# Patient Record
Sex: Female | Born: 1980
Health system: Southern US, Community
[De-identification: ages and names within clinical notes are randomized; demographics above are authoritative.]

## PROBLEM LIST (undated history)

## (undated) DIAGNOSIS — I10 Essential (primary) hypertension: Secondary | ICD-10-CM

## (undated) DIAGNOSIS — E785 Hyperlipidemia, unspecified: Secondary | ICD-10-CM

## (undated) HISTORY — DX: Essential (primary) hypertension: I10

## (undated) HISTORY — DX: Hyperlipidemia, unspecified: E78.5

---

## 2003-02-15 ENCOUNTER — Emergency Department (HOSPITAL_COMMUNITY): Admission: EM | Admit: 2003-02-15 | Discharge: 2003-02-15 | Payer: Self-pay | Admitting: *Deleted

## 2010-02-17 ENCOUNTER — Encounter: Admission: RE | Admit: 2010-02-17 | Discharge: 2010-02-17 | Payer: Self-pay | Admitting: Family Medicine

## 2012-01-05 ENCOUNTER — Ambulatory Visit (INDEPENDENT_AMBULATORY_CARE_PROVIDER_SITE_OTHER): Payer: 59 | Admitting: Family Medicine

## 2012-01-05 VITALS — BP 136/97 | HR 73 | Temp 98.6°F | Resp 18 | Ht 66.0 in | Wt 164.0 lb

## 2012-01-05 DIAGNOSIS — M6530 Trigger finger, unspecified finger: Secondary | ICD-10-CM

## 2012-01-05 MED ORDER — METHYLPREDNISOLONE 4 MG PO TABS: ORAL_TABLET | ORAL | Status: DC

## 2012-01-05 NOTE — Patient Instructions (Addendum)
Trigger Finger Trigger finger (digital tendinitis and stenosing tenosynovitis) is a common disorder that causes an often painful catching of the fingers or thumb. It occurs as a clicking, snapping or locking of a finger in the palm of the hand. The reason for this is that there is a problem with the tendons which flex the fingers sliding smoothly through their sheaths. The cause of this may be inflammation of the tendon and sheath, or from a thickening or nodule in the tendon. The condition may occur in any finger or a couple fingers at the same time. The cause may be overuse while doing the same activity over and over again with your hands.  Tendons are the tough cords that connect the muscles to bones. Muscles and tendons are part of the system which allows your body to move. When muscles contract in the forearm on the palm side, they pull the tendons toward the elbow and cause the fingers and thumb to bend (flex) toward the palm. These are the flexor tendons. The tendons slide through a slippery smooth membrane (synovium) which is called the tendon sheath. The sheaths have areas of tough fibrous tissues surrounding them which hold the tendons close to the bone. These are called pulleys because they work like a pulley. The first pulley is in the palm of the hand near the crease which runs across your palm. If the area of the tendon thickening is near the pulley, the tendon cannot slide smoothly through the pulley and this causes the trigger finger. The finger may lock with the finger curled or suddenly straighten out with a snap. This is more common in patients with rheumatoid arthritis and diabetes. Left untreated, the condition may get worse to the point where the finger becomes locked in flexion, like making a fist, or less commonly locked with the finger straightened out. DIAGNOSIS  Your caregiver will easily make this diagnosis on examination. TREATMENT   Splinting for 6 to 8 weeks of time may be  helpful. Use the splints as your caregiver suggests.   Heat used for twenty minutes at least four times a day followed by ice packs for twenty minutes unless directed otherwise by your caregiver may be helpful. If you find either heat or cold seems to be making the problem worse, quit using them and ask your caregiver for directions.   Cortisone injections along with splinting may speed up recovery. Several injections may be required. Cortisone may give relief after one injection.   Only take over-the-counter or prescription medicines for pain, discomfort, or fever as directed by your caregiver.   Surgery is another treatment that may be used if conservative treatments using injection and splinting does not work. Surgery can be minor without incisions (a cut does not have to be made) and can be done with a needle through the skin. No stitches are needed and most patients may return to work the same day.   Other surgical choices involve an open procedure where the surgeon opens the hand through a small incision (cut) and cuts the pulley so the tendon can again slide smoothly. Your hand will still work fine. This small operation requires stitches and the recovery will be a little longer and the incisions will need to be protected until completely healed. You may have to limit your activities for up to 6 months.   Occupational or hand therapy may be required if there is stiffness remaining in the finger.  RISKS AND COMPLICATIONS Complications are uncommon but   some problems that may occur are:  Recurrence of the trigger finger. This does not mean that the surgery was not well done. It simply means that you may have formed scar tissue following surgery that causes the problem to reoccur.   Infection which could ruin the results of the surgery and can result in a finger which is frozen and can not move normally.   Nerve injury is possible which could result in permanent numbness of one or more fingers.   CARE AFTER SURGERY  Elevate your hand above your heart and use ice as instructed.   Follow instructions regarding finger motion/exercise.   Keep the surgical wound dry for at least 48 hrs or longer if instructed.   Keep your follow-up appointments.   Return to work and normal activities as instructed.  SEEK IMMEDIATE MEDICAL CARE IF:  Your problems are getting worse or you do not obtain relief from the treatment. Document Released: 05/01/2004 Document Revised: 07/01/2011 Document Reviewed: 12/24/2008 [employer] Patient Information 2012 [employer]. 

## 2012-01-05 NOTE — Progress Notes (Signed)
31 year old woman who comes in with pain in her right thumb. She works hanging close repetitively. She is right-handed. The pain has gotten worse in the last 24 hours and is associated with swelling around the base of the right thumb. She's had no specific trauma and has no wrist pain or neck pain.  Objective: There's a popping sensation when patient flexes her right thumb and this is felt in the web space between her thumb and index finger at about the MCP joint. There is minimal swelling in the right thenar area as well. Range of motion is normal both hands, fingers, wrist, and elbow.  Skin: No ecchymosis or rash  Assessment: Trigger finger of thumb flexor  Plan: If the referral and Medrol 4 mg 2 daily for one week. Patient also provided with a work note for today and a thumb spica splint.

## 2012-07-11 ENCOUNTER — Ambulatory Visit (INDEPENDENT_AMBULATORY_CARE_PROVIDER_SITE_OTHER): Payer: 59 | Admitting: Physician Assistant

## 2012-07-11 VITALS — BP 130/90 | HR 93 | Temp 98.5°F | Resp 18 | Ht 65.0 in | Wt 177.0 lb

## 2012-07-11 DIAGNOSIS — R509 Fever, unspecified: Secondary | ICD-10-CM

## 2012-07-11 DIAGNOSIS — R52 Pain, unspecified: Secondary | ICD-10-CM

## 2012-07-11 DIAGNOSIS — J029 Acute pharyngitis, unspecified: Secondary | ICD-10-CM

## 2012-07-11 LAB — POCT RAPID STREP A (OFFICE): Rapid Strep A Screen: NEGATIVE

## 2012-07-11 MED ORDER — ACETAMINOPHEN 325 MG PO TABS: 1000.0000 mg | ORAL_TABLET | Freq: Once | ORAL | Status: DC

## 2012-07-11 MED ORDER — OSELTAMIVIR PHOSPHATE 75 MG PO CAPS: 75.0000 mg | ORAL_CAPSULE | Freq: Two times a day (BID) | ORAL | Status: DC

## 2012-07-11 NOTE — Progress Notes (Signed)
  Subjective:    Patient ID: Christina Gallegos, female    DOB: [birth year], 31 y.o.   MRN: [id number]  HPI 31 yr old AAF presents with a 1 day h/o ST, body aches, fever, runny nose, slight cough.  It all came on suddenly yesterday. She hasn't taken any OTC meds. No known exposure to strep or flu.  Review of Systems  All other systems reviewed and are negative.       Objective:   Physical Exam  Nursing note and vitals reviewed. Constitutional: She is oriented to person, place, and time. She appears well-developed and well-nourished.  HENT:  Head: Normocephalic and atraumatic.  Mouth/Throat: Oropharyngeal exudate: PND and mild erythema.  Neck: Normal range of motion. Neck supple. No thyromegaly present.  Cardiovascular: Normal rate, regular rhythm and normal heart sounds.   Pulmonary/Chest: Effort normal and breath sounds normal.  Lymphadenopathy:    She has no cervical adenopathy.  Neurological: She is alert and oriented to person, place, and time.  Skin: Skin is warm and dry.  Psychiatric: She has a normal mood and affect. Her behavior is normal.   Results for orders placed in visit on 07/11/12  POCT INFLUENZA A/B      Component Value Range   Influenza A, POC Negative     Influenza B, POC Negative    POCT RAPID STREP A (OFFICE)      Component Value Range   Rapid Strep A Screen Negative  Negative        Assessment & Plan:  Clinically suspicious of flu-will treat with tamiflu, fluids, and rest.  advil or tylenol as needed.  Recheck in 48-72 hrs if not much improved; sooner if worse.

## 2013-03-31 ENCOUNTER — Ambulatory Visit (INDEPENDENT_AMBULATORY_CARE_PROVIDER_SITE_OTHER): Payer: 59 | Admitting: Family Medicine

## 2013-03-31 VITALS — BP 116/76 | HR 65 | Temp 98.3°F | Resp 16 | Ht 65.5 in | Wt 178.0 lb

## 2013-03-31 DIAGNOSIS — R519 Headache, unspecified: Secondary | ICD-10-CM

## 2013-03-31 DIAGNOSIS — Z0000 Encounter for general adult medical examination without abnormal findings: Secondary | ICD-10-CM

## 2013-03-31 LAB — POCT CBC
Granulocyte percent: 48.7 %G (ref 37–80)
HCT, POC: 41.4 % (ref 37.7–47.9)
Hemoglobin: 13.4 g/dL (ref 12.2–16.2)
Lymph, poc: 1.2 (ref 0.6–3.4)
MCH, POC: 30.5 pg (ref 27–31.2)
MCHC: 32.4 g/dL (ref 31.8–35.4)
MCV: 94.1 fL (ref 80–97)
MID (cbc): 0.2 (ref 0–0.9)
MPV: 8.5 fL (ref 0–99.8)
POC Granulocyte: 1.4 — AB (ref 2–6.9)
POC LYMPH PERCENT: 42.9 %L (ref 10–50)
POC MID %: 8.4 %M (ref 0–12)
Platelet Count, POC: 272 10*3/uL (ref 142–424)
RBC: 4.4 M/uL (ref 4.04–5.48)
RDW, POC: 13.2 %
WBC: 2.8 10*3/uL — AB (ref 4.6–10.2)

## 2013-03-31 LAB — COMPREHENSIVE METABOLIC PANEL
ALT: 11 U/L (ref 0–35)
AST: 16 U/L (ref 0–37)
Albumin: 4.3 g/dL (ref 3.5–5.2)
Alkaline Phosphatase: 45 U/L (ref 39–117)
BUN: 9 mg/dL (ref 6–23)
CO2: 27 mEq/L (ref 19–32)
Calcium: 9.1 mg/dL (ref 8.4–10.5)
Chloride: 104 mEq/L (ref 96–112)
Creat: 0.78 mg/dL (ref 0.50–1.10)
Glucose, Bld: 91 mg/dL (ref 70–99)
Potassium: 4.2 mEq/L (ref 3.5–5.3)
Sodium: 136 mEq/L (ref 135–145)
Total Bilirubin: 0.4 mg/dL (ref 0.3–1.2)
Total Protein: 7.3 g/dL (ref 6.0–8.3)

## 2013-03-31 LAB — POCT UA - MICROSCOPIC ONLY
Casts, Ur, LPF, POC: NEGATIVE
Crystals, Ur, HPF, POC: NEGATIVE
Yeast, UA: NEGATIVE

## 2013-03-31 LAB — POCT URINALYSIS DIPSTICK
Bilirubin, UA: NEGATIVE
Glucose, UA: NEGATIVE
Ketones, UA: NEGATIVE
Leukocytes, UA: NEGATIVE
Nitrite, UA: NEGATIVE
Protein, UA: NEGATIVE
Spec Grav, UA: 1.02
Urobilinogen, UA: 1
pH, UA: 7

## 2013-03-31 LAB — TSH: TSH: 2.948 u[IU]/mL (ref 0.350–4.500)

## 2013-03-31 MED ORDER — KETOPROFEN 50 MG PO CAPS: 50.0000 mg | ORAL_CAPSULE | Freq: Four times a day (QID) | ORAL | Status: DC | PRN

## 2013-03-31 NOTE — Patient Instructions (Signed)
The blood count suggest that you have a viral infection that is causing these headaches. I would like you to come back in 5-6 days to recheck the blood count and make sure that your body is responding nicely to the headache medicine. At that time we will go ahead and review all the labs that we've done today to

## 2013-03-31 NOTE — Progress Notes (Signed)
Annual physical exam - Plan: POCT CBC, POCT urinalysis dipstick, POCT UA - Microscopic Only, Comprehensive metabolic panel, TSH, Pap IG (Image Guided)  Signed, Elvina Sidle, MD   Patient ID: Christina Gallegos MRN: [id number], DOB: [birth year], 32 y.o. Date of Encounter: 03/31/2013, 9:05 AM  Primary Physician: No PCP Per Patient  Chief Complaint: Physical (CPE)  HPI: 32 y.o. y/o female with history of noted below here for CPE.  Doing well. 32 year old woman who works at [employer]. She's been having headaches for last 4 days. His headaches occur in the afternoon and are migratory, meaning that they move around from side to side of her head. She's tried aspirin and Aleve without much improvement. At one time she was able leg down to the headache go away. Said no nausea or scotoma. He does not have a family history of migraines.  LMP: Aug 16 Pap:  Years ago QMV:HQION Review of Systems: Consitutional: No fever, chills, fatigue, night sweats, lymphadenopathy, or weight changes. Eyes: No visual changes, eye redness, or discharge. ENT/Mouth: Ears: No otalgia, tinnitus, hearing loss, discharge. Nose: No congestion, rhinorrhea, sinus pain, or epistaxis. Throat: No sore throat, post nasal drip, or teeth pain. Cardiovascular: No CP, palpitations, diaphoresis, DOE, edema, orthopnea, PND. Respiratory: No cough, hemoptysis, SOB, or wheezing. Gastrointestinal: No anorexia, dysphagia, reflux, pain, nausea, vomiting, hematemesis, diarrhea, constipation, BRBPR, or melena. Breast: No discharge, pain, swelling, or mass. Genitourinary: No dysuria, frequency, urgency, hematuria, incontinence, nocturia, amenorrhea, vaginal discharge, pruritis, burning, abnormal bleeding, or pain. Musculoskeletal: No decreased ROM, myalgias, stiffness, joint swelling, or weakness. Skin: No rash, erythema, lesion changes, pain, warmth, jaundice, or pruritis. Neurological: No dizziness, syncope, seizures, tremors, memory  loss, coordination problems, or paresthesias. Psychological: No anxiety, depression, hallucinations, SI/HI. Endocrine: No fatigue, polydipsia, polyphagia, polyuria, or known diabetes. All other systems were reviewed and are otherwise negative.  No past medical history on file.   No past surgical history on file.  Home Meds:  Prior to Admission medications   Medication Sig Start Date End Date Taking? Authorizing Provider  ibuprofen (ADVIL,MOTRIN) 200 MG tablet Take 200 mg by mouth every 6 (six) hours as needed for pain.   Yes Historical Provider, MD  oseltamivir (TAMIFLU) 75 MG capsule Take 1 capsule (75 mg total) by mouth 2 (two) times daily. 07/11/12   Anders Simmonds, PA-C    Allergies: No Known Allergies  History   Social History  . Marital Status: Single    Spouse Name: N/A    Number of Children: N/A  . Years of Education: N/A   Occupational History  . Not on file.   Social History Main Topics  . Smoking status: Never Smoker   . Smokeless tobacco: Not on file  . Alcohol Use: No  . Drug Use: No  . Sexual Activity: Yes   Other Topics Concern  . Not on file   Social History Narrative  . No narrative on file    No family history on file.  Physical Exam: Blood pressure 116/76, pulse 65, temperature 98.3 F (36.8 C), temperature source Oral, resp. rate 16, height 5' 5.5" (1.664 m), weight 178 lb (80.74 kg), last menstrual period 03/20/2013, SpO2 99.00%., Body mass index is 29.16 kg/(m^2). General: Well developed, well nourished, in no acute distress. HEENT: Normocephalic, atraumatic. Conjunctiva pink, sclera non-icteric. Pupils 2 mm constricting to 1 mm, round, regular, and equally reactive to light and accomodation. EOMI. Internal auditory canal clear. TMs with good cone of light and without pathology. Nasal mucosa pink. Nares  are without discharge. No sinus tenderness. Oral mucosa pink. Dentition: Good shape. Pharynx without exudate.   Neck: Supple. Trachea midline.  No thyromegaly. Full ROM. No lymphadenopathy. Lungs: Clear to auscultation bilaterally without wheezes, rales, or rhonchi. Breathing is of normal effort and unlabored. Cardiovascular: RRR with S1 S2. No murmurs, rubs, or gallops appreciated. Distal pulses 2+ symmetrically. No carotid or abdominal bruits. Breast: Symmetrical. No masses. Nipples without discharge. Abdomen: Soft, non-tender, non-distended with normoactive bowel sounds. No hepatosplenomegaly or masses. No rebound/guarding. No CVA tenderness. Without hernias.  Genitourinary:  External genitalia without lesions. Vaginal mucosa pink. Cervix pink and without discharge. No cervical or adnexal tenderness. Pap smear taken Musculoskeletal: Full range of motion and 5/5 strength throughout. Without swelling, atrophy, tenderness, crepitus, or warmth. Extremities without clubbing, cyanosis, or edema. Calves supple. Skin: Warm and moist without erythema, ecchymosis, wounds, or rash. Neuro: A+Ox3. CN II-XII grossly intact. Moves all extremities spontaneously. Full sensation throughout. Normal gait. DTR 2+ throughout upper and lower extremities. Finger to nose intact. Psych:  Responds to questions appropriately with a normal affect.   Studies: CBC, CMET, Lipid, TSH all pending. Patient is generally healthy but does have recent headaches. Results for orders placed in visit on 03/31/13  POCT CBC      Result Value Range   WBC 2.8 (*) 4.6 - 10.2 K/uL   Lymph, poc 1.2  0.6 - 3.4   POC LYMPH PERCENT 42.9  10 - 50 %L   MID (cbc) 0.2  0 - 0.9   POC MID % 8.4  0 - 12 %M   POC Granulocyte 1.4 (*) 2 - 6.9   Granulocyte percent 48.7  37 - 80 %G   RBC 4.40  4.04 - 5.48 M/uL   Hemoglobin 13.4  12.2 - 16.2 g/dL   HCT, POC 29.5  62.1 - 47.9 %   MCV 94.1  80 - 97 fL   MCH, POC 30.5  27 - 31.2 pg   MCHC 32.4  31.8 - 35.4 g/dL   RDW, POC 30.8     Platelet Count, POC 272  142 - 424 K/uL   MPV 8.5  0 - 99.8 fL  POCT URINALYSIS DIPSTICK      Result Value  Range   Color, UA yellow     Clarity, UA clear     Glucose, UA neg     Bilirubin, UA neg     Ketones, UA neg     Spec Grav, UA 1.020     Blood, UA trace     pH, UA 7.0     Protein, UA neg     Urobilinogen, UA 1.0     Nitrite, UA neg     Leukocytes, UA Negative    POCT UA - MICROSCOPIC ONLY      Result Value Range   WBC, Ur, HPF, POC 2-5     RBC, urine, microscopic 0-2     Bacteria, U Microscopic trace     Mucus, UA moderate     Epithelial cells, urine per micros 8-12     Crystals, Ur, HPF, POC neg     Casts, Ur, LPF, POC neg     Yeast, UA neg     Amorphous small       Assessment/Plan:  33 y.o. y/o female here for CPE. The headaches appear to be tension related but with a low white blood count it's very possible that she has a viral infection going on. She'll need to have that  CBC rechecked in one week. Annual physical exam - Plan: POCT CBC, POCT urinalysis dipstick, POCT UA - Microscopic Only, Comprehensive metabolic panel, TSH, Pap IG (Image Guided)   Signed, Elvina Sidle, MD 03/31/2013 9:05 AM

## 2013-04-02 LAB — PAP IG (IMAGE GUIDED)

## 2013-04-16 ENCOUNTER — Encounter (HOSPITAL_COMMUNITY): Payer: Self-pay | Admitting: *Deleted

## 2013-04-16 ENCOUNTER — Emergency Department (HOSPITAL_COMMUNITY)
Admission: EM | Admit: 2013-04-16 | Discharge: 2013-04-17 | Disposition: A | Discharge status: Home / Self Care | Payer: 59 | Attending: Emergency Medicine | Admitting: Emergency Medicine

## 2013-04-16 DIAGNOSIS — R21 Rash and other nonspecific skin eruption: Secondary | ICD-10-CM | POA: Insufficient documentation

## 2013-04-16 DIAGNOSIS — L509 Urticaria, unspecified: Secondary | ICD-10-CM | POA: Insufficient documentation

## 2013-04-16 DIAGNOSIS — T6591XA Toxic effect of unspecified substance, accidental (unintentional), initial encounter: Secondary | ICD-10-CM | POA: Insufficient documentation

## 2013-04-16 DIAGNOSIS — T7840XA Allergy, unspecified, initial encounter: Secondary | ICD-10-CM | POA: Insufficient documentation

## 2013-04-16 DIAGNOSIS — Y929 Unspecified place or not applicable: Secondary | ICD-10-CM | POA: Insufficient documentation

## 2013-04-16 DIAGNOSIS — R1310 Dysphagia, unspecified: Secondary | ICD-10-CM | POA: Insufficient documentation

## 2013-04-16 DIAGNOSIS — Y939 Activity, unspecified: Secondary | ICD-10-CM | POA: Insufficient documentation

## 2013-04-16 NOTE — ED Notes (Signed)
Pt in c/ generalized rash since this am, states she thinks she is having an allergic reaction to something, states she feels like her face is swollen since this am, pt took benadryl this am, no airway involvement

## 2013-04-17 MED ORDER — FAMOTIDINE 20 MG PO TABS: 20.0000 mg | ORAL_TABLET | Freq: Two times a day (BID) | ORAL | Status: DC

## 2013-04-17 MED ORDER — DIPHENHYDRAMINE HCL 25 MG PO CAPS
25.0000 mg | ORAL_CAPSULE | Freq: Once | ORAL | Status: AC
  Administered 2013-04-17: 25 mg via ORAL
  Filled 2013-04-17: qty 1

## 2013-04-17 MED ORDER — PREDNISONE 20 MG PO TABS
60.0000 mg | ORAL_TABLET | Freq: Once | ORAL | Status: AC
  Administered 2013-04-17: 60 mg via ORAL
  Filled 2013-04-17: qty 3

## 2013-04-17 MED ORDER — FAMOTIDINE 20 MG PO TABS
20.0000 mg | ORAL_TABLET | Freq: Once | ORAL | Status: AC
  Administered 2013-04-17: 20 mg via ORAL
  Filled 2013-04-17: qty 1

## 2013-04-17 MED ORDER — PREDNISONE 20 MG PO TABS: 40.0000 mg | ORAL_TABLET | Freq: Every day | ORAL | Status: DC

## 2013-04-17 NOTE — ED Provider Notes (Signed)
CSN: [id number]     Arrival date & time 04/16/13  2229 History   First MD Initiated Contact with Patient 04/17/13 0011     Chief Complaint  Patient presents with  . Allergic Reaction   (Consider location/radiation/quality/duration/timing/severity/associated sxs/prior Treatment) HPI Comments: Patient noticed itchy red raised areas on her abdomen this morning that than spread to other body areas that would wax and wane in location and intensity Took OTC Benadryl with temprary relief Denies SOB, throat closing  tachycardia   Patient is a 32 y.o. female presenting with allergic reaction. The history is provided by the patient.  Allergic Reaction Presenting symptoms: difficulty swallowing, itching and rash   Presenting symptoms: no difficulty breathing, no swelling and no wheezing   Itching:    Location:  Full body   Severity:  Mild   Duration:  1 day   Timing:  Intermittent   Progression:  Worsening Severity:  Mild Prior allergic episodes:  No prior episodes Context: no chemicals, no cosmetics, no dairy/milk products, no eggs, no food allergies, no insect bite/sting, no jewelry/metal, no medications, no nuts and no poison ivy   Relieved by:  Antihistamines   History reviewed. No pertinent past medical history. History reviewed. No pertinent past surgical history. History reviewed. No pertinent family history. History  Substance Use Topics  . Smoking status: Never Smoker   . Smokeless tobacco: Not on file  . Alcohol Use: No   OB History   Grav Para Term Preterm Abortions TAB SAB Ect Mult Living                 Review of Systems  Unable to perform ROS Constitutional: Negative for fever and chills.  HENT: Positive for trouble swallowing.   Respiratory: Negative for shortness of breath and wheezing.   Gastrointestinal: Negative for nausea and abdominal pain.  Skin: Positive for itching and rash.  Neurological: Negative for dizziness.  All other systems reviewed and are  negative.    Allergies  Review of patient's allergies indicates no known allergies.  Home Medications   Current Outpatient Rx  Name  Route  Sig  Dispense  Refill  . famotidine (PEPCID) 20 MG tablet   Oral   Take 1 tablet (20 mg total) by mouth 2 (two) times daily.   20 tablet   0   . predniSONE (DELTASONE) 20 MG tablet   Oral   Take 2 tablets (40 mg total) by mouth daily.   10 tablet   0    BP 130/87  Pulse 94  Temp(Src) 98 F (36.7 C) (Oral)  Resp 20  Wt 172 lb (78.019 kg)  BMI 28.18 kg/m2  SpO2 100%  LMP 03/20/2013 Physical Exam  Nursing note and vitals reviewed. Constitutional: She is oriented to person, place, and time. She appears well-developed and well-nourished. No distress.  HENT:  Head: Normocephalic and atraumatic.  Eyes: Pupils are equal, round, and reactive to light.  Neck: Normal range of motion.  Cardiovascular: Normal rate.   Pulmonary/Chest: Effort normal and breath sounds normal. No respiratory distress. She has no wheezes.  Musculoskeletal: She exhibits no tenderness.  Neurological: She is alert and oriented to person, place, and time.  Skin: Skin is warm. Rash noted.    ED Course  Procedures (including critical care time) Labs Review Labs Reviewed - No data to display Imaging Review No results found.  MDM   1. Urticaria    Will treat with Pepcid, Prednisone      Cipriano Mile  Manus Rudd, NP 04/17/13 [phone]

## 2013-04-17 NOTE — ED Provider Notes (Signed)
Medical screening examination/treatment/procedure(s) were performed by non-physician practitioner and as supervising physician I was immediately available for consultation/collaboration.  [name], MD 04/17/13 0604 

## 2014-10-31 ENCOUNTER — Other Ambulatory Visit: Payer: Self-pay | Admitting: Physician Assistant

## 2014-10-31 ENCOUNTER — Other Ambulatory Visit (HOSPITAL_COMMUNITY)
Admission: RE | Admit: 2014-10-31 | Discharge: 2014-10-31 | Disposition: A | Discharge status: Home / Self Care | Payer: 59 | Source: Ambulatory Visit | Attending: Family Medicine | Admitting: Family Medicine

## 2014-10-31 DIAGNOSIS — Z124 Encounter for screening for malignant neoplasm of cervix: Secondary | ICD-10-CM | POA: Insufficient documentation

## 2014-11-05 LAB — CYTOLOGY - PAP

## 2015-04-28 ENCOUNTER — Ambulatory Visit (INDEPENDENT_AMBULATORY_CARE_PROVIDER_SITE_OTHER): Payer: 59 | Admitting: Family Medicine

## 2015-04-28 ENCOUNTER — Ambulatory Visit
Admission: RE | Admit: 2015-04-28 | Discharge: 2015-04-28 | Disposition: A | Discharge status: Home / Self Care | Payer: 59 | Source: Ambulatory Visit | Attending: Urgent Care | Admitting: Urgent Care

## 2015-04-28 VITALS — BP 126/80 | HR 112 | Temp 98.9°F | Ht 65.0 in | Wt 162.0 lb

## 2015-04-28 DIAGNOSIS — R102 Pelvic and perineal pain: Secondary | ICD-10-CM | POA: Diagnosis not present

## 2015-04-28 DIAGNOSIS — Z8742 Personal history of other diseases of the female genital tract: Secondary | ICD-10-CM

## 2015-04-28 DIAGNOSIS — Z86018 Personal history of other benign neoplasm: Secondary | ICD-10-CM

## 2015-04-28 DIAGNOSIS — N83201 Unspecified ovarian cyst, right side: Secondary | ICD-10-CM | POA: Diagnosis not present

## 2015-04-28 LAB — POCT URINALYSIS DIP (MANUAL ENTRY)
BILIRUBIN UA: NEGATIVE
Glucose, UA: NEGATIVE
Ketones, POC UA: NEGATIVE
LEUKOCYTES UA: NEGATIVE
NITRITE UA: NEGATIVE
PH UA: 5.5
PROTEIN UA: NEGATIVE
Spec Grav, UA: 1.015
Urobilinogen, UA: 0.2

## 2015-04-28 LAB — POCT CBC
Granulocyte percent: 81.2 %G — AB (ref 37–80)
HCT, POC: 37 % — AB (ref 37.7–47.9)
Hemoglobin: 12.2 g/dL (ref 12.2–16.2)
Lymph, poc: 1 (ref 0.6–3.4)
MCH, POC: 27.8 pg (ref 27–31.2)
MCHC: 32.9 g/dL (ref 31.8–35.4)
MCV: 84.6 fL (ref 80–97)
MID (CBC): 0.2 (ref 0–0.9)
MPV: 7.2 fL (ref 0–99.8)
PLATELET COUNT, POC: 314 10*3/uL (ref 142–424)
POC Granulocyte: 5.1 (ref 2–6.9)
POC LYMPH %: 15.1 % (ref 10–50)
POC MID %: 3.7 %M (ref 0–12)
RBC: 4.38 M/uL (ref 4.04–5.48)
RDW, POC: 14.4 %
WBC: 6.3 10*3/uL (ref 4.6–10.2)

## 2015-04-28 LAB — POCT WET + KOH PREP
TRICH BY WET PREP: ABSENT
Yeast by KOH: ABSENT
Yeast by wet prep: ABSENT

## 2015-04-28 LAB — POC MICROSCOPIC URINALYSIS (UMFC): Mucus: ABSENT

## 2015-04-28 LAB — POCT URINE PREGNANCY: PREG TEST UR: NEGATIVE

## 2015-04-28 MED ORDER — OXYCODONE-ACETAMINOPHEN 5-325 MG PO TABS: 1.0000 | ORAL_TABLET | Freq: Three times a day (TID) | ORAL | Status: DC | PRN

## 2015-04-28 NOTE — Progress Notes (Signed)
MRN: [id number] DOB: 23-Feb-1981  Subjective:   Christina Gallegos is a 34 y.o. female presenting for chief complaint of Abdominal Pain; Nausea; and Emesis  Reports ~2 month history of intermittent pelvic pain, pain has been constant since last night, also had nausea and vomiting x1. Pain is crampy in nature and does not radiate, sometimes has dyspareunia. Has not tried any medications for relief. Denies fever, genital rashes, vaginal discharge, dysuria, hematuria, urinary frequency, cloudy or malodorous urine, abdominal pain, flank pain, decreased appetite. Last menstrual cycle was 1st week in 02/2015. Patient has very heavy cycles, has a cycle every month except for this past September. Of note, in 2011, patient had very similar pain and was diagnosed with ruptured ovarian cyst as seen on pelvic CT. She was also diagnosed with a fibroid. Does not take any contraception. She is married, denies concern for STI. Denies any other aggravating or relieving factors, no other questions or concerns.  Jaila has a current medication list which includes the following prescription(s): multiple vitamins-minerals. Also has No Known Allergies.  Essance  has no past medical history on file. Also  has no past surgical history on file.  Objective:   Vitals: BP 126/80 mmHg  Pulse 112  Temp(Src) 98.9 F (37.2 C) (Oral)  Ht  (1.651 m)  Wt 162 lb (73.483 kg)  BMI 26.96 kg/m2  SpO2 99%  LMP 04/07/2015 (Approximate)  Pulse was 88 on recheck by PA-Jannifer Fischler.   Physical Exam  Constitutional: She is oriented to person, place, and time. She appears well-developed and well-nourished.  Cardiovascular: Normal rate, regular rhythm and intact distal pulses.  Exam reveals no gallop and no friction rub.   No murmur heard. Pulmonary/Chest: No respiratory distress. She has no wheezes. She has no rales.  Abdominal: Soft. Bowel sounds are normal. She exhibits no distension and no mass. There is tenderness (throughout,  worst in pelvic area).  Genitourinary: There is no rash, tenderness, lesion or injury on the right labia. There is no rash, tenderness, lesion or injury on the left labia. Uterus is not deviated, not enlarged and not tender. Cervix exhibits motion tenderness. Cervix exhibits no discharge and no friability. Right adnexum displays tenderness. Right adnexum displays no mass and no fullness. Left adnexum displays tenderness (more than right side). Left adnexum displays no mass and no fullness. No erythema, tenderness or bleeding in the vagina. No foreign body around the vagina. No signs of injury around the vagina. Vaginal discharge (excessive thick white discharge) found.  Musculoskeletal: She exhibits no edema.  Neurological: She is alert and oriented to person, place, and time.  Skin: Skin is warm and dry. No rash noted. No erythema. No pallor.  Psychiatric: She has a normal mood and affect.   Results for orders placed or performed in visit on 04/28/15 (from the past 24 hour(s))  POCT urinalysis dipstick     Status: Abnormal   Collection Time: 04/28/15  9:36 AM  Result Value Ref Range   Color, UA yellow yellow   Clarity, UA clear clear   Glucose, UA negative negative   Bilirubin, UA negative negative   Ketones, POC UA negative negative   Spec Grav, UA 1.015    Blood, UA moderate (A) negative   pH, UA 5.5    Protein Ur, POC negative negative   Urobilinogen, UA 0.2    Nitrite, UA Negative Negative   Leukocytes, UA Negative Negative  POCT Microscopic Urinalysis ([facility])     Status: Abnormal  Collection Time: 04/28/15  9:36 AM  Result Value Ref Range   WBC,UR,HPF,POC None None WBC/hpf   RBC,UR,HPF,POC Moderate (A) None RBC/hpf   Bacteria None None   Mucus Absent Absent   Epithelial Cells, UR Per Microscopy Few (A) None cells/hpf  POCT urine pregnancy     Status: None   Collection Time: 04/28/15  9:36 AM  Result Value Ref Range   Preg Test, Ur Negative Negative  POCT Wet + KOH Prep ([facility])      Status: Abnormal   Collection Time: 04/28/15 10:11 AM  Result Value Ref Range   Yeast by KOH Absent Present, Absent   Yeast by wet prep Absent Present, Absent   WBC by wet prep Few None, Few   Clue Cells Wet Prep HPF POC Few (A) None   Trich by wet prep Absent Present, Absent   Bacteria Wet Prep HPF POC Moderate (A) None, Few   Epithelial Cells By [employer] Pref ([facility]) Moderate (A) None, Few   RBC,UR,HPF,POC None None RBC/hpf  POCT CBC     Status: Abnormal   Collection Time: 04/28/15 10:34 AM  Result Value Ref Range   WBC 6.3 4.6 - 10.2 K/uL   Lymph, poc 1.0 0.6 - 3.4   POC LYMPH PERCENT 15.1 10 - 50 %L   MID (cbc) 0.2 0 - 0.9   POC MID % 3.7 0 - 12 %M   POC Granulocyte 5.1 2 - 6.9   Granulocyte percent 81.2 (A) 37 - 80 %G   RBC 4.38 4.04 - 5.48 M/uL   Hemoglobin 12.2 12.2 - 16.2 g/dL   HCT, POC 16.1 (A) 09.6 - 47.9 %   MCV 84.6 80 - 97 fL   MCH, POC 27.8 27 - 31.2 pg   MCHC 32.9 31.8 - 35.4 g/dL   RDW, POC 04.5 %   Platelet Count, POC 314 142 - 424 K/uL   MPV 7.2 0 - 99.8 fL   Assessment and Plan :   1. Pelvic pain in female 2. History of ovarian cyst 3. History of uterine fibroid - Unclear etiology, labs pending, suspect recurrent ovarian cyst versus fibroid. STAT US today and consider CT abdomen/pelvis if [place] negative. Percocet for pain. Work note provided. Consider starting OCPs at follow up.  Wallis Bamberg, PA-C [facility] [phone] 04/28/2015 9:07 AM   addnd by Arthor Captain, MD.  I have examined this pt's belly and have discussed case with the pt and with Mr. Urban Gibson.  Received [place] results as below-    EXAM: TRANSABDOMINAL AND TRANSVAGINAL ULTRASOUND OF PELVIS  TECHNIQUE: Both transabdominal and transvaginal ultrasound examinations of the pelvis were performed. Transabdominal technique was performed for global imaging of the pelvis including uterus, ovaries, adnexal regions, and pelvic cul-de-sac. It was necessary to  proceed with endovaginal exam following the transabdominal exam to visualize the endometrium.  COMPARISON: CT 02/17/2010  FINDINGS: Uterus  Measurements: 9.6 x 4.1 x 5.0 cm. Multiple predominately subserosal fibroids. Subserosal right fundal fibroid measures up to 3.0 cm. Subserosal posterior fibroid measures up to 3.1 cm. Left subserosal fundal fibroid measures up to 2.8 cm. Several other smaller fibroids less than 2 cm noted.  Endometrium Thickness: 12 mm in thickness. No focal abnormality visualized.  Right ovary Measurements: 4.6 x 4.5 x 5.2 cm. 3.8 x 3.6 x 2.7 cm complex cyst with reticular pattern internal echoes.  Left ovary Measurements: 3.6 x 2.3 x 3.7 cm. Multiple small follicles. Normal appearance/no adnexal  mass. Other findings  Small amount of free fluid in the pelvis.  IMPRESSION: Fibroid uterus as above. 3.8 cm complex right ovarian cyst, likely hemorrhagic cyst. This could be followed with repeat ultrasound in 3-6 months to ensure resolution. Small amount of free fluid.  Discussed with radiologist- we did not do doppler flow on her ovaries.  However there is good color flow making torsion unlikely.  Called pt to discuss- she just took a percocet a few minutes ago, is still having pain.  Advised her that her pain is most likely due to this ovarian cyst.  Especially given normal WBC count appendicitis is unlikely.  However if the percocet does not help her pain, she should proceed to the ER for further evaluation (perhaps a CT) and treatment of her pain. Also, we will plan to repeat her [place] in 3-6 months to make sure the cyst resolved.  I will go ahead and order this now

## 2015-04-28 NOTE — Patient Instructions (Addendum)
Pelvic Pain Female pelvic pain can be caused by many different things and start from a variety of places. Pelvic pain refers to pain that is located in the lower half of the abdomen and between your hips. The pain may occur over a short period of time (acute) or may be reoccurring (chronic). The cause of pelvic pain may be related to disorders affecting the female reproductive organs (gynecologic), but it may also be related to the bladder, kidney stones, an intestinal complication, or muscle or skeletal problems. Getting help right away for pelvic pain is important, especially if there has been severe, sharp, or a sudden onset of unusual pain. It is also important to get help right away because some types of pelvic pain can be life threatening.  CAUSES  Below are only some of the causes of pelvic pain. The causes of pelvic pain can be in one of several categories.   Gynecologic.  Pelvic inflammatory disease.  Sexually transmitted infection.  Ovarian cyst or a twisted ovarian ligament (ovarian torsion).  Uterine lining that grows outside the uterus (endometriosis).  Fibroids, cysts, or tumors.  Ovulation.  Pregnancy.  Pregnancy that occurs outside the uterus (ectopic pregnancy).  Miscarriage.  Labor.  Abruption of the placenta or ruptured uterus.  Infection.  Uterine infection (endometritis).  Bladder infection.  Diverticulitis.  Miscarriage related to a uterine infection (septic abortion).  Bladder.  Inflammation of the bladder (cystitis).  Kidney stone(s).  Gastrointestinal.  Constipation.  Diverticulitis.  Neurologic.  Trauma.  Feeling pelvic pain because of mental or emotional causes (psychosomatic).  Cancers of the bowel or pelvis. EVALUATION  Your caregiver will want to take a careful history of your concerns. This includes recent changes in your health, a careful gynecologic history of your periods (menses), and a sexual history. Obtaining your family  history and medical history is also important. Your caregiver may suggest a pelvic exam. A pelvic exam will help identify the location and severity of the pain. It also helps in the evaluation of which organ system may be involved. In order to identify the cause of the pelvic pain and be properly treated, your caregiver may order tests. These tests may include:   A pregnancy test.  Pelvic ultrasonography.  An X-ray exam of the abdomen.  A urinalysis or evaluation of vaginal discharge.  Blood tests. HOME CARE INSTRUCTIONS   Only take over-the-counter or prescription medicines for pain, discomfort, or fever as directed by your caregiver.   Rest as directed by your caregiver.   Eat a balanced diet.   Drink enough fluids to make your urine clear or pale yellow, or as directed.   Avoid sexual intercourse if it causes pain.   Apply warm or cold compresses to the lower abdomen depending on which one helps the pain.   Avoid stressful situations.   Keep a journal of your pelvic pain. Write down when it started, where the pain is located, and if there are things that seem to be associated with the pain, such as food or your menstrual cycle.  Follow up with your caregiver as directed.  SEEK MEDICAL CARE IF:  Your medicine does not help your pain.  You have abnormal vaginal discharge. SEEK IMMEDIATE MEDICAL CARE IF:   You have heavy bleeding from the vagina.   Your pelvic pain increases.   You feel light-headed or faint.   You have chills.   You have pain with urination or blood in your urine.   You have uncontrolled diarrhea   or vomiting.   You have a fever or persistent symptoms for more than 3 days.  You have a fever and your symptoms suddenly get worse.   You are being physically or sexually abused.  MAKE SURE YOU:  Understand these instructions.  Will watch your condition.  Will get help if you are not doing well or get worse. Document Released:  06/08/2004 Document Revised: 11/26/2013 Document Reviewed: 11/01/2011 [facility] Patient Information 2015 [place], [place]. This information is not intended to replace advice given to you by your health care provider. Make sure you discuss any questions you have with your health care provider.   Your appointment is with [facility] @ 2:25 pm today, located at [address] [place], [phone]. One hour before your appointment, you should drink 32 ounces of water and hold your bladder until your appointment time. Bladder must be full at the time of study.

## 2015-04-29 ENCOUNTER — Telehealth: Payer: Self-pay | Admitting: Family Medicine

## 2015-04-29 LAB — GC/CHLAMYDIA PROBE AMP
CT PROBE, AMP APTIMA: NEGATIVE
GC PROBE AMP APTIMA: NEGATIVE

## 2015-04-29 NOTE — Telephone Encounter (Signed)
Called to check on her- she reports that her pain is waxing and waning. Encouraged her to go to the ER if not doing ok, or to come in for a recheck at clinic

## 2015-05-01 ENCOUNTER — Telehealth: Payer: Self-pay | Admitting: Urgent Care

## 2015-05-01 NOTE — Telephone Encounter (Signed)
Patient reports that she is doing significantly better with regard to her pelvic pain. She has a female PCP with [facility] on [address], [place], [place]. Will refer to [facility] for management counseling on multiple fibroids and complex right ovarian cyst as a source for her severe pelvic pain.

## 2015-05-06 ENCOUNTER — Telehealth: Payer: Self-pay

## 2015-05-06 NOTE — Telephone Encounter (Signed)
Records faxed to [place] at [facility].

## 2015-05-06 NOTE — Telephone Encounter (Signed)
Shameka from [place] at [facility] left voicemail on Monday at 4:42pm requesting records and ultrasound records for patient. Cb# [phone] and fax# is [phone].

## 2015-06-09 ENCOUNTER — Other Ambulatory Visit (HOSPITAL_COMMUNITY): Payer: Self-pay | Admitting: Obstetrics & Gynecology

## 2015-06-09 DIAGNOSIS — Z3169 Encounter for other general counseling and advice on procreation: Secondary | ICD-10-CM

## 2015-06-11 ENCOUNTER — Encounter (INDEPENDENT_AMBULATORY_CARE_PROVIDER_SITE_OTHER): Payer: Self-pay

## 2015-06-11 ENCOUNTER — Ambulatory Visit (HOSPITAL_COMMUNITY)
Admission: RE | Admit: 2015-06-11 | Discharge: 2015-06-11 | Disposition: A | Discharge status: Home / Self Care | Payer: 59 | Source: Ambulatory Visit | Attending: Obstetrics & Gynecology | Admitting: Obstetrics & Gynecology

## 2015-06-11 DIAGNOSIS — Z3169 Encounter for other general counseling and advice on procreation: Secondary | ICD-10-CM

## 2015-06-11 DIAGNOSIS — N979 Female infertility, unspecified: Secondary | ICD-10-CM | POA: Insufficient documentation

## 2015-06-11 MED ORDER — IOHEXOL 300 MG/ML  SOLN
20.0000 mL | Freq: Once | INTRAMUSCULAR | Status: DC | PRN
  Administered 2015-06-11: 20 mL
  Filled 2015-06-11: qty 20

## 2015-11-10 ENCOUNTER — Other Ambulatory Visit (HOSPITAL_COMMUNITY)
Admission: RE | Admit: 2015-11-10 | Discharge: 2015-11-10 | Disposition: A | Discharge status: Home / Self Care | Payer: 59 | Source: Ambulatory Visit | Attending: Family Medicine | Admitting: Family Medicine

## 2015-11-10 DIAGNOSIS — Z124 Encounter for screening for malignant neoplasm of cervix: Secondary | ICD-10-CM | POA: Insufficient documentation

## 2015-11-11 ENCOUNTER — Other Ambulatory Visit: Payer: Self-pay | Admitting: Physician Assistant

## 2015-11-18 LAB — CYTOLOGY - PAP

## 2017-09-16 ENCOUNTER — Ambulatory Visit: Payer: 59 | Admitting: Urgent Care

## 2018-03-29 DIAGNOSIS — N898 Other specified noninflammatory disorders of vagina: Secondary | ICD-10-CM | POA: Diagnosis not present

## 2018-03-29 DIAGNOSIS — Z113 Encounter for screening for infections with a predominantly sexual mode of transmission: Secondary | ICD-10-CM | POA: Diagnosis not present

## 2018-07-27 ENCOUNTER — Other Ambulatory Visit: Payer: Self-pay | Admitting: Physician Assistant

## 2018-07-27 DIAGNOSIS — N6320 Unspecified lump in the left breast, unspecified quadrant: Secondary | ICD-10-CM

## 2018-08-02 ENCOUNTER — Ambulatory Visit
Admission: RE | Admit: 2018-08-02 | Discharge: 2018-08-02 | Disposition: A | Discharge status: Home / Self Care | Payer: BLUE CROSS/BLUE SHIELD | Source: Ambulatory Visit | Attending: Physician Assistant | Admitting: Physician Assistant

## 2018-08-02 ENCOUNTER — Ambulatory Visit
Admission: RE | Admit: 2018-08-02 | Discharge: 2018-08-02 | Disposition: A | Discharge status: Home / Self Care | Payer: 59 | Source: Ambulatory Visit | Attending: Physician Assistant | Admitting: Physician Assistant

## 2018-08-02 DIAGNOSIS — R922 Inconclusive mammogram: Secondary | ICD-10-CM | POA: Diagnosis not present

## 2018-08-02 DIAGNOSIS — N6320 Unspecified lump in the left breast, unspecified quadrant: Secondary | ICD-10-CM

## 2018-08-02 DIAGNOSIS — N6324 Unspecified lump in the left breast, lower inner quadrant: Secondary | ICD-10-CM | POA: Diagnosis not present

## 2018-11-21 DIAGNOSIS — R0989 Other specified symptoms and signs involving the circulatory and respiratory systems: Secondary | ICD-10-CM | POA: Diagnosis not present

## 2018-11-21 DIAGNOSIS — K219 Gastro-esophageal reflux disease without esophagitis: Secondary | ICD-10-CM | POA: Diagnosis not present

## 2018-11-21 DIAGNOSIS — J309 Allergic rhinitis, unspecified: Secondary | ICD-10-CM | POA: Diagnosis not present

## 2019-01-19 DIAGNOSIS — Z0000 Encounter for general adult medical examination without abnormal findings: Secondary | ICD-10-CM | POA: Diagnosis not present

## 2019-01-25 DIAGNOSIS — Z0000 Encounter for general adult medical examination without abnormal findings: Secondary | ICD-10-CM | POA: Diagnosis not present

## 2019-01-25 DIAGNOSIS — Z8249 Family history of ischemic heart disease and other diseases of the circulatory system: Secondary | ICD-10-CM | POA: Diagnosis not present

## 2019-01-30 DIAGNOSIS — R1313 Dysphagia, pharyngeal phase: Secondary | ICD-10-CM | POA: Diagnosis not present

## 2019-06-04 ENCOUNTER — Telehealth (INDEPENDENT_AMBULATORY_CARE_PROVIDER_SITE_OTHER): Payer: Self-pay | Admitting: Otolaryngology

## 2019-06-04 NOTE — Telephone Encounter (Signed)
Patient called about persistence of globus type symptoms which seem worse in the morning. I suggested that she may take the omeprazole twice daily for couple weeks to see if this improves symptoms.  I reassured her of normal upper airway examination.  She will continue with the Nasacort. If symptoms persist suggested possible visit with gastroenterology.

## 2019-06-13 DIAGNOSIS — Z113 Encounter for screening for infections with a predominantly sexual mode of transmission: Secondary | ICD-10-CM | POA: Diagnosis not present

## 2019-06-13 DIAGNOSIS — A599 Trichomoniasis, unspecified: Secondary | ICD-10-CM | POA: Diagnosis not present

## 2019-06-13 DIAGNOSIS — N898 Other specified noninflammatory disorders of vagina: Secondary | ICD-10-CM | POA: Diagnosis not present

## 2019-06-27 DIAGNOSIS — A599 Trichomoniasis, unspecified: Secondary | ICD-10-CM | POA: Diagnosis not present

## 2019-08-31 DIAGNOSIS — Z209 Contact with and (suspected) exposure to unspecified communicable disease: Secondary | ICD-10-CM | POA: Diagnosis not present

## 2020-01-22 ENCOUNTER — Other Ambulatory Visit (HOSPITAL_COMMUNITY)
Admission: RE | Admit: 2020-01-22 | Discharge: 2020-01-22 | Disposition: A | Discharge status: Home / Self Care | Payer: BC Managed Care – PPO | Source: Ambulatory Visit | Attending: Physician Assistant | Admitting: Physician Assistant

## 2020-01-22 ENCOUNTER — Other Ambulatory Visit: Payer: Self-pay | Admitting: Physician Assistant

## 2020-01-22 DIAGNOSIS — Z0000 Encounter for general adult medical examination without abnormal findings: Secondary | ICD-10-CM | POA: Diagnosis not present

## 2020-01-22 DIAGNOSIS — Z124 Encounter for screening for malignant neoplasm of cervix: Secondary | ICD-10-CM | POA: Diagnosis not present

## 2020-01-22 DIAGNOSIS — Z13220 Encounter for screening for lipoid disorders: Secondary | ICD-10-CM | POA: Diagnosis not present

## 2020-01-22 DIAGNOSIS — Z131 Encounter for screening for diabetes mellitus: Secondary | ICD-10-CM | POA: Diagnosis not present

## 2020-01-24 LAB — CYTOLOGY - PAP: Diagnosis: NEGATIVE

## 2020-04-14 DIAGNOSIS — L918 Other hypertrophic disorders of the skin: Secondary | ICD-10-CM | POA: Diagnosis not present

## 2020-04-25 DIAGNOSIS — U071 COVID-19: Secondary | ICD-10-CM | POA: Diagnosis not present

## 2020-05-27 DIAGNOSIS — L918 Other hypertrophic disorders of the skin: Secondary | ICD-10-CM | POA: Diagnosis not present

## 2020-08-03 DIAGNOSIS — Z20822 Contact with and (suspected) exposure to covid-19: Secondary | ICD-10-CM | POA: Diagnosis not present

## 2020-11-21 IMAGING — US ULTRASOUND LEFT BREAST LIMITED
1 series · 7 of 7 positions shown · non-contrast
Comparison: None.

CLINICAL DATA: 37-year-old with a superficial palpable lump
involving the INNER LEFT breast over the past 3 months with the
patient states has decreased in size in the interval though
persists. This is the patient's initial baseline mammogram.

Family history of breast cancer in her paternal grandmother.
EXAM:
DIGITAL DIAGNOSTIC BILATERAL MAMMOGRAM WITH CAD AND TOMO
ULTRASOUND LEFT BREAST

[Series 1: ultrasound left breast limited · 0.06mm/px · 7 of 7 slices shown]
[im 1/7]
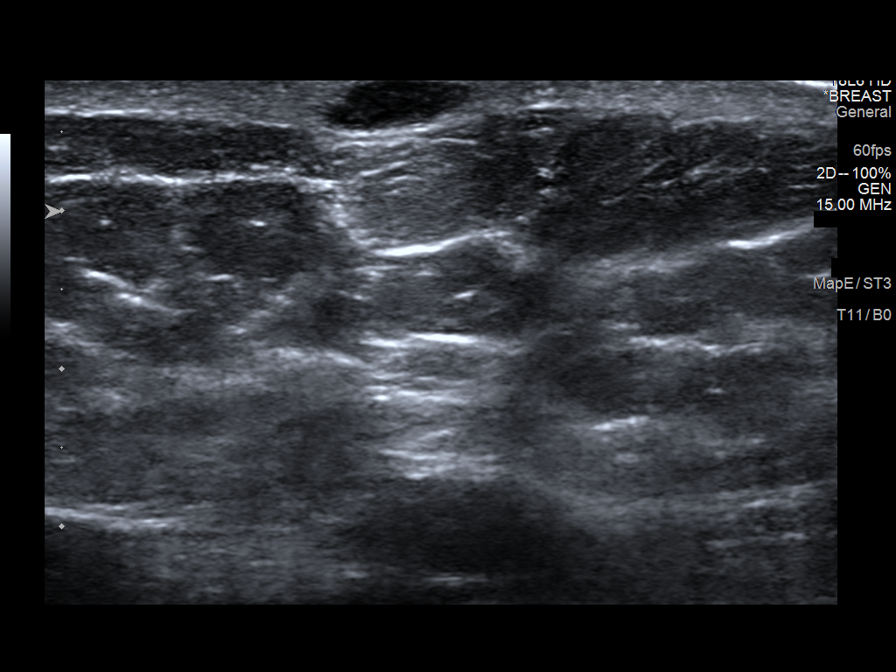
[im 2/7]
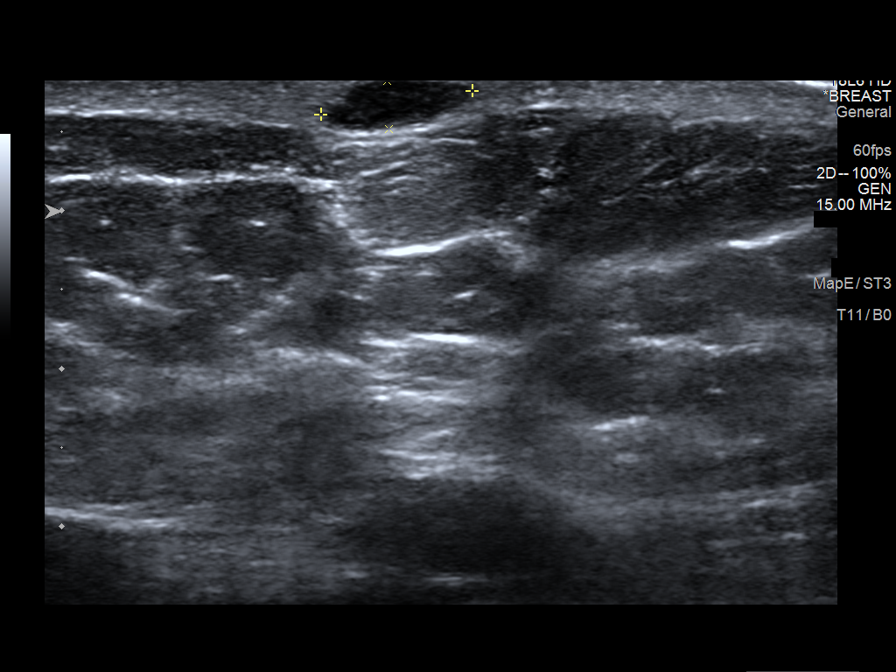
[im 3/7]
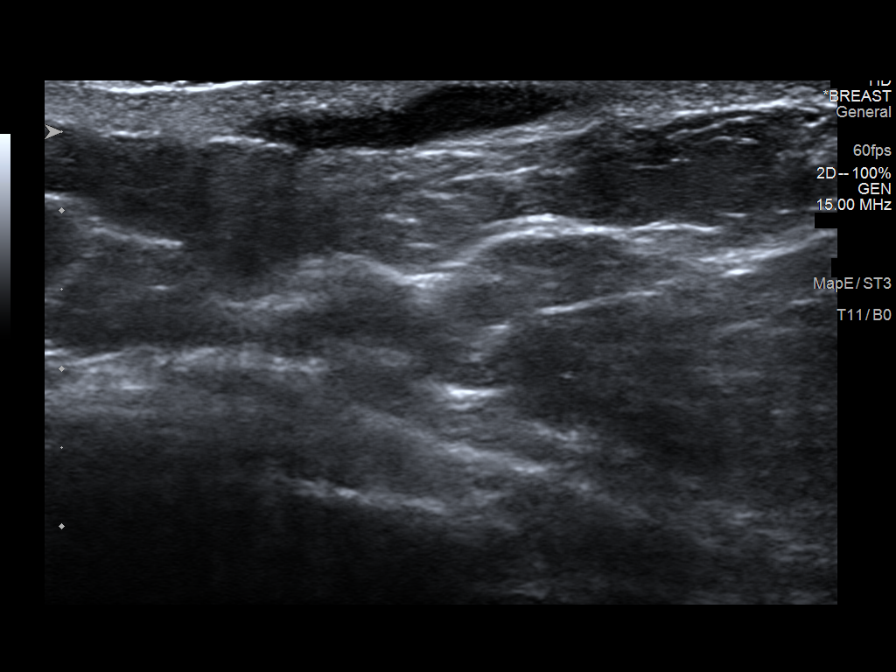
[im 4/7]
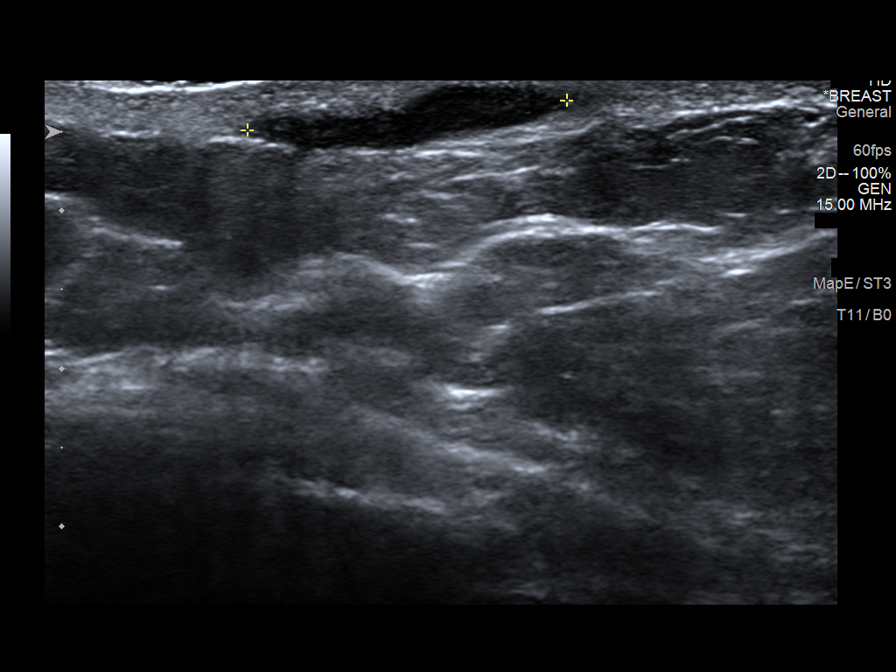
[im 5/7]
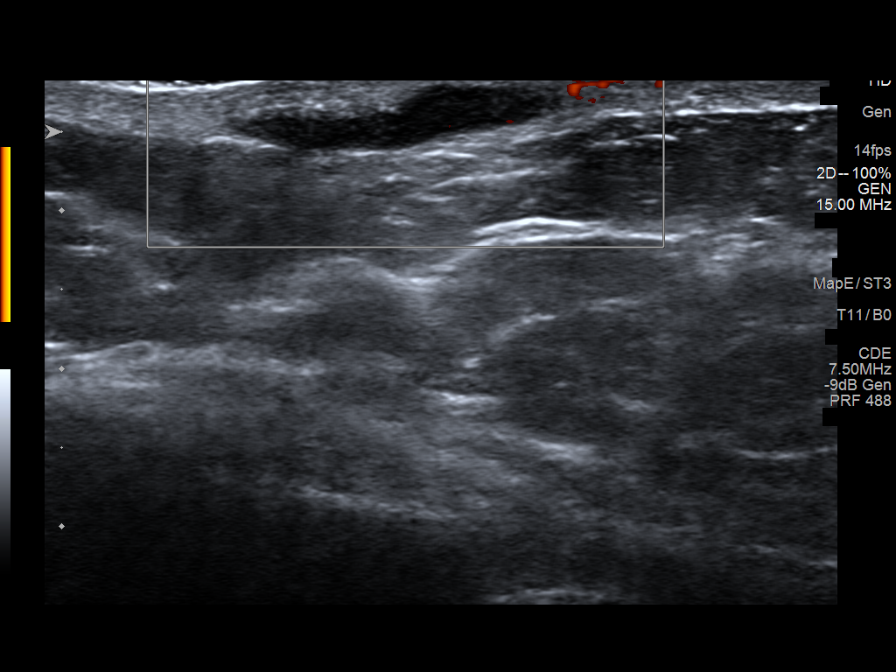
[im 6/7]
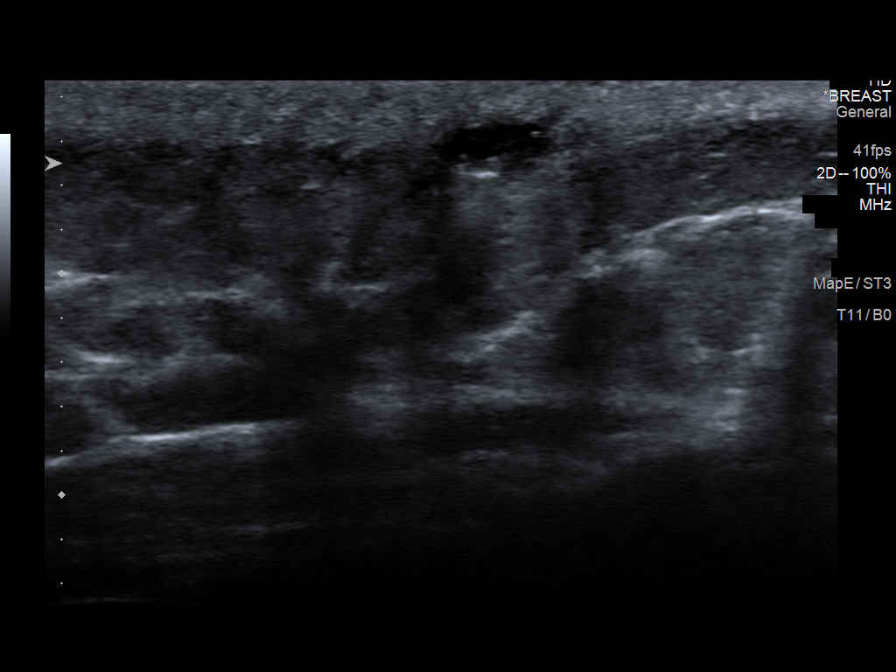
[im 7/7]
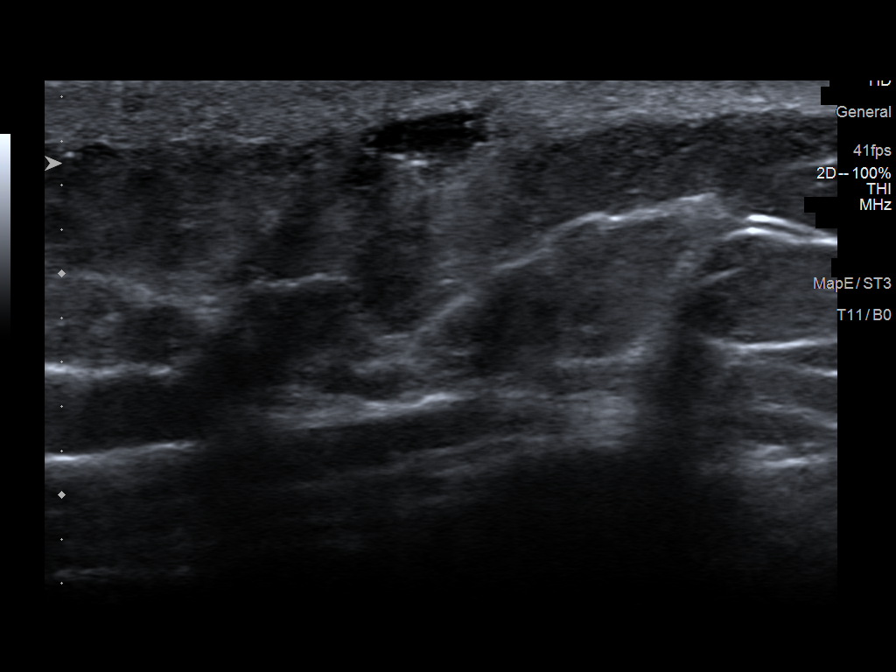

[7 of 7 positions shown; findings below may reference images not displayed]

ACR Breast Density Category c: The breast tissue is heterogeneously
dense, which may obscure small masses.
FINDINGS: Tomosynthesis and synthesized full field CC and MLO views of both
breasts were obtained. Tomosynthesis and synthesized spot
compression tangential view of the area of concern in the LEFT
breast was also obtained.

Focal skin thickening is present in the area of palpable concern in
the INNER LEFT breast at POSTERIOR depth. There is no associated
architectural distortion or suspicious calcifications. No suspicious
findings in the LEFT breast.

No findings suspicious for malignancy in the RIGHT breast.

Mammographic images were processed with CAD.

On correlative physical exam, there is a visible and palpable
superficial mass involving the LOWER INNER LEFT breast corresponding
with what the patient is feeling.

Targeted LEFT breast ultrasound is performed, showing an oval
circumscribed parallel anechoic mass entirely located within the
skin of the LOWER INNER breast at the 8:30 o'clock position
approximately 10 cm from the nipple measuring approximately 0.3 x
1.0 x 2.0 cm. A small tract is visualized extending from the mass to
the skin surface. The underlying breast tissues are normal in
appearance.
IMPRESSION: 1. Benign sebaceous cyst involving the skin of the LOWER INNER LEFT
breast which accounts for the palpable concern.
2. No mammographic or sonographic evidence of malignancy involving
the LEFT breast.
3. No mammographic evidence of malignancy involving the RIGHT
breast.

RECOMMENDATION:
Screening mammogram at age 42 unless there are persistent or
intervening clinical concerns. (Code:ON-Z-RZ6)

I have discussed the findings and recommendations with the patient.
Results were also provided in writing at the conclusion of the
visit. If applicable, a reminder letter will be sent to the patient
regarding the next appointment.

BI-RADS CATEGORY  2: Benign.

## 2020-11-21 IMAGING — MG DIGITAL DIAGNOSTIC BILATERAL MAMMOGRAM WITH TOMO AND CAD
6 of 10 series · 6 of 30 positions shown · non-contrast
Comparison: None.

CLINICAL DATA: 37-year-old with a superficial palpable lump
involving the INNER LEFT breast over the past 3 months with the
patient states has decreased in size in the interval though
persists. This is the patient's initial baseline mammogram.

Family history of breast cancer in her paternal grandmother.
EXAM:
DIGITAL DIAGNOSTIC BILATERAL MAMMOGRAM WITH CAD AND TOMO
ULTRASOUND LEFT BREAST

[R MLO synth-2D]
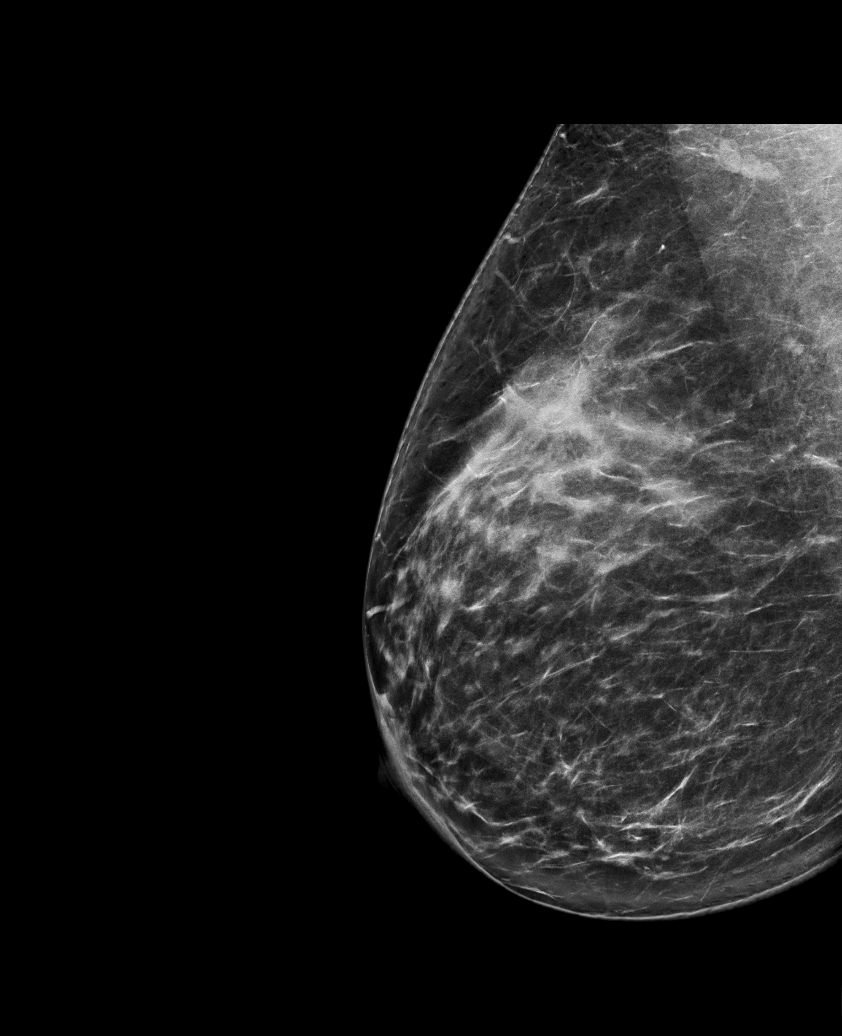

[R CC synth-2D]
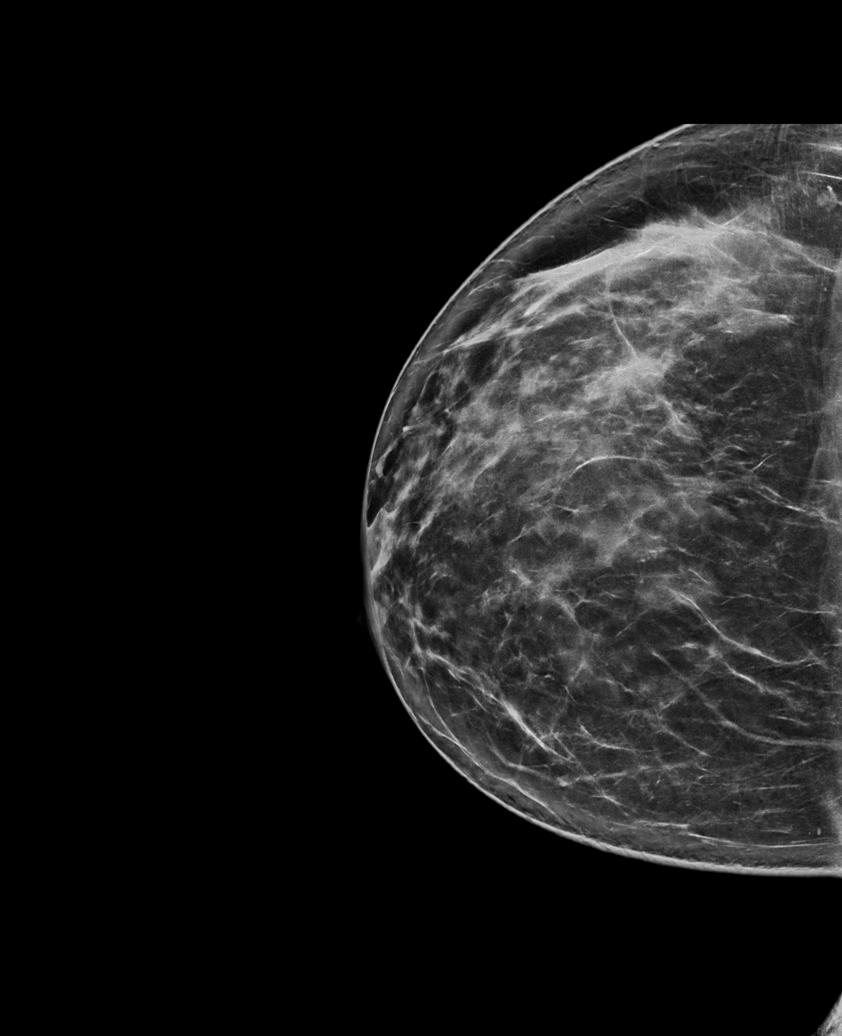

[L MLO synth-2D]
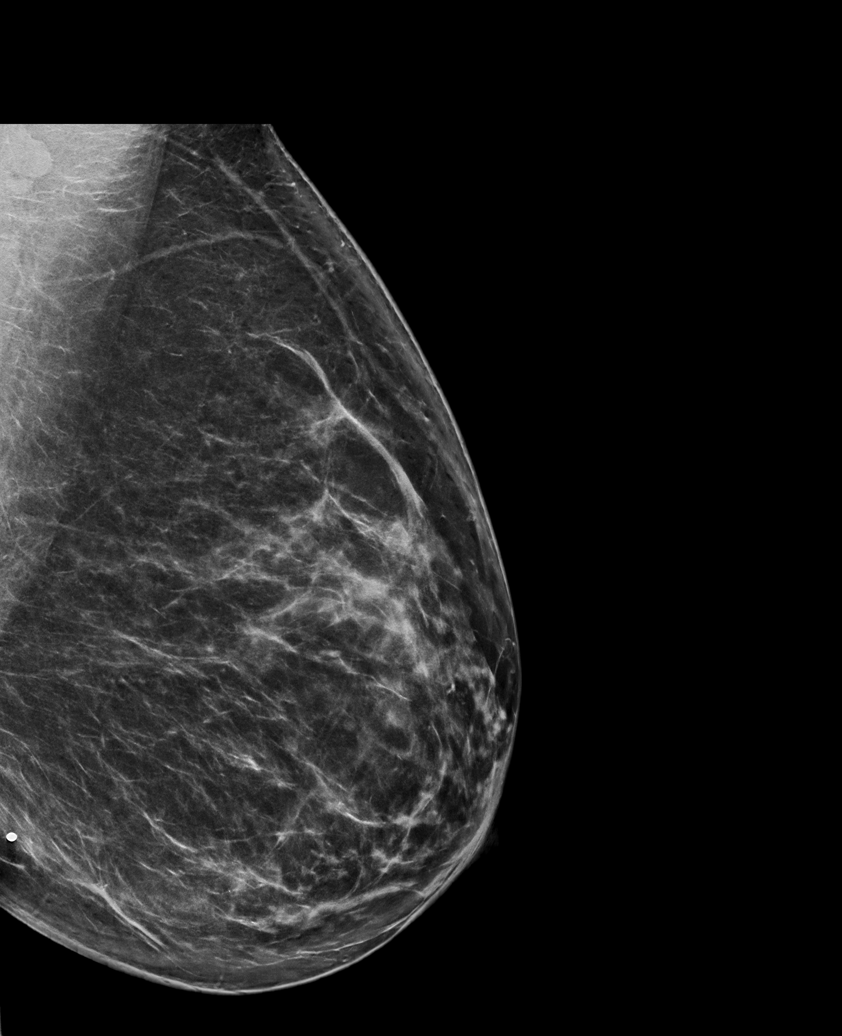

[L CC synth-2D (1 of 2)]
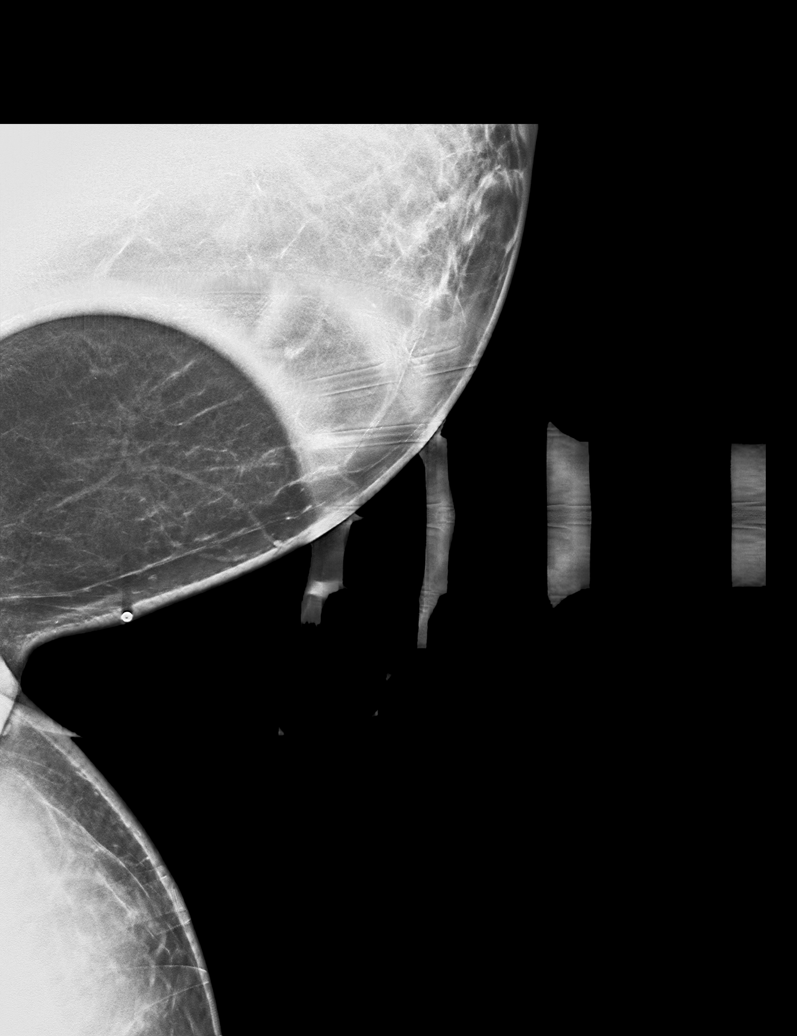

[L CC synth-2D (2 of 2)]
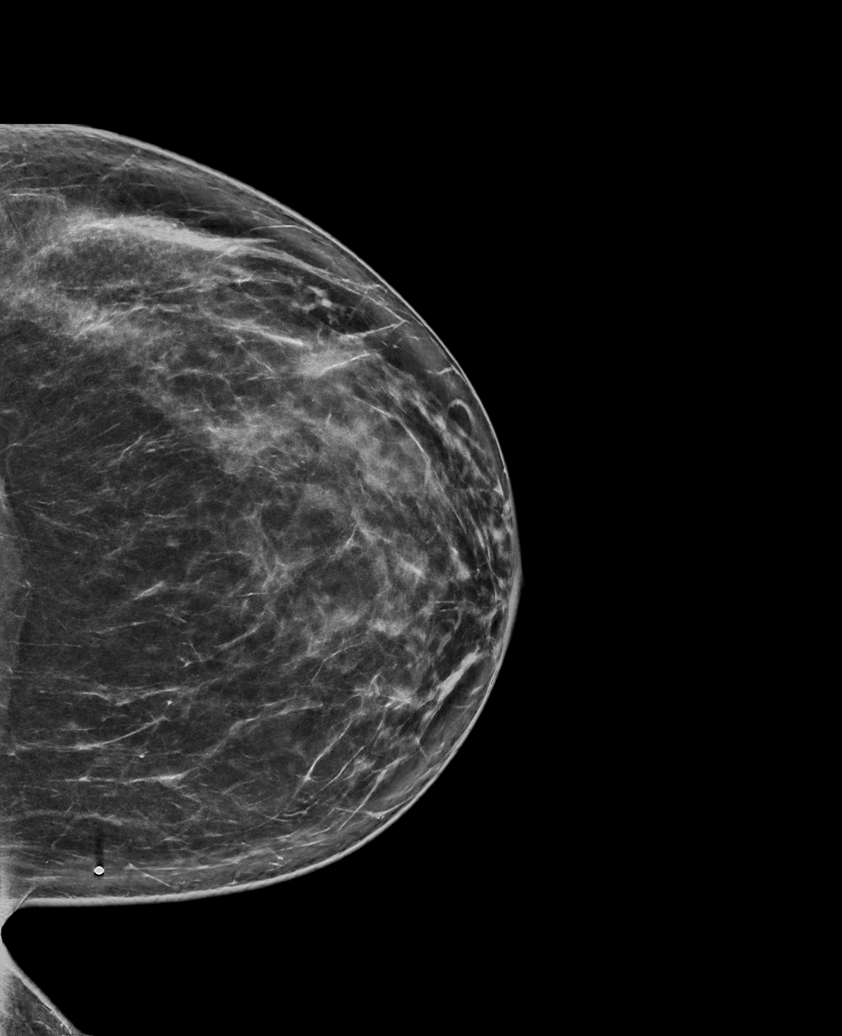

[L MLO tomo · tomo slice 45/89.0]
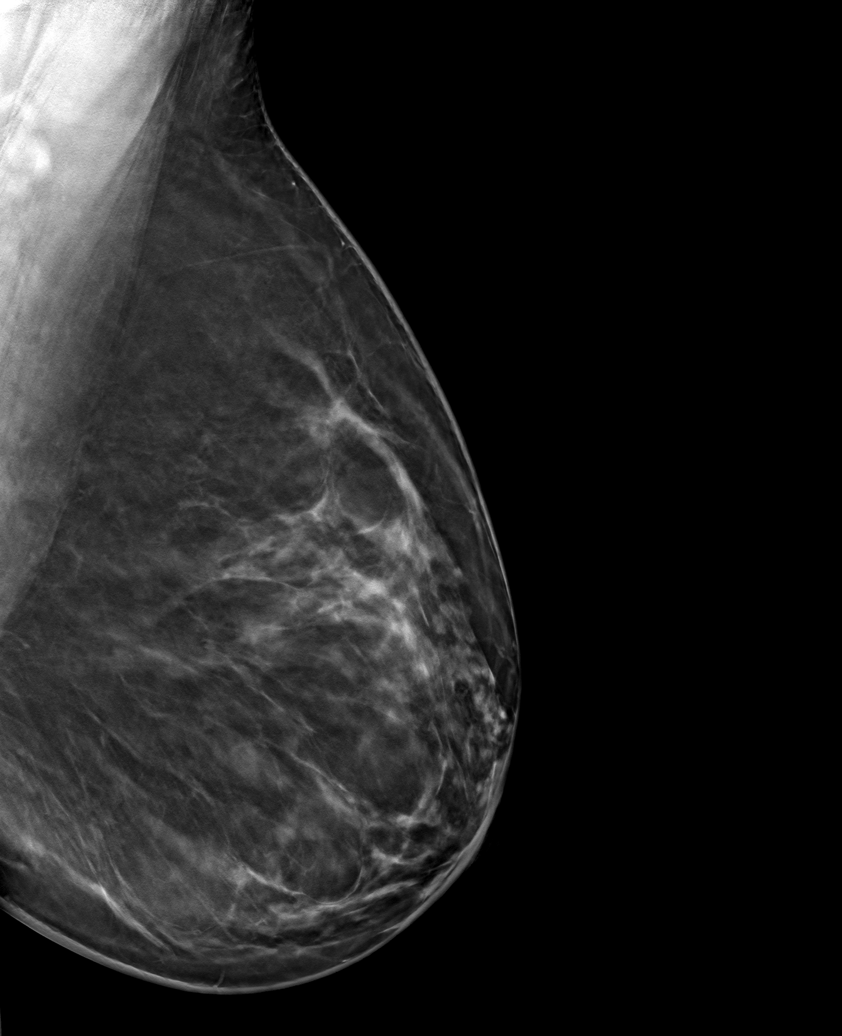

[6 of 30 positions shown; findings below may reference images not displayed]

ACR Breast Density Category c: The breast tissue is heterogeneously
dense, which may obscure small masses.
FINDINGS: Tomosynthesis and synthesized full field CC and MLO views of both
breasts were obtained. Tomosynthesis and synthesized spot
compression tangential view of the area of concern in the LEFT
breast was also obtained.

Focal skin thickening is present in the area of palpable concern in
the INNER LEFT breast at POSTERIOR depth. There is no associated
architectural distortion or suspicious calcifications. No suspicious
findings in the LEFT breast.

No findings suspicious for malignancy in the RIGHT breast.

Mammographic images were processed with CAD.

On correlative physical exam, there is a visible and palpable
superficial mass involving the LOWER INNER LEFT breast corresponding
with what the patient is feeling.

Targeted LEFT breast ultrasound is performed, showing an oval
circumscribed parallel anechoic mass entirely located within the
skin of the LOWER INNER breast at the 8:30 o'clock position
approximately 10 cm from the nipple measuring approximately 0.3 x
1.0 x 2.0 cm. A small tract is visualized extending from the mass to
the skin surface. The underlying breast tissues are normal in
appearance.
IMPRESSION: 1. Benign sebaceous cyst involving the skin of the LOWER INNER LEFT
breast which accounts for the palpable concern.
2. No mammographic or sonographic evidence of malignancy involving
the LEFT breast.
3. No mammographic evidence of malignancy involving the RIGHT
breast.

RECOMMENDATION:
Screening mammogram at age 42 unless there are persistent or
intervening clinical concerns. (Code:ON-Z-RZ6)

I have discussed the findings and recommendations with the patient.
Results were also provided in writing at the conclusion of the
visit. If applicable, a reminder letter will be sent to the patient
regarding the next appointment.

BI-RADS CATEGORY  2: Benign.

## 2021-01-22 DIAGNOSIS — Z0000 Encounter for general adult medical examination without abnormal findings: Secondary | ICD-10-CM | POA: Diagnosis not present

## 2021-02-27 DIAGNOSIS — D72819 Decreased white blood cell count, unspecified: Secondary | ICD-10-CM | POA: Diagnosis not present

## 2021-03-10 DIAGNOSIS — N760 Acute vaginitis: Secondary | ICD-10-CM | POA: Diagnosis not present

## 2021-07-30 DIAGNOSIS — Z20822 Contact with and (suspected) exposure to covid-19: Secondary | ICD-10-CM | POA: Diagnosis not present

## 2022-02-02 DIAGNOSIS — Z13220 Encounter for screening for lipoid disorders: Secondary | ICD-10-CM | POA: Diagnosis not present

## 2022-02-02 DIAGNOSIS — B354 Tinea corporis: Secondary | ICD-10-CM | POA: Diagnosis not present

## 2022-02-02 DIAGNOSIS — N920 Excessive and frequent menstruation with regular cycle: Secondary | ICD-10-CM | POA: Diagnosis not present

## 2022-02-02 DIAGNOSIS — R030 Elevated blood-pressure reading, without diagnosis of hypertension: Secondary | ICD-10-CM | POA: Diagnosis not present

## 2022-02-02 DIAGNOSIS — Z0000 Encounter for general adult medical examination without abnormal findings: Secondary | ICD-10-CM | POA: Diagnosis not present

## 2022-03-03 DIAGNOSIS — M25512 Pain in left shoulder: Secondary | ICD-10-CM | POA: Diagnosis not present

## 2022-03-03 DIAGNOSIS — G629 Polyneuropathy, unspecified: Secondary | ICD-10-CM | POA: Diagnosis not present

## 2022-04-01 DIAGNOSIS — M79602 Pain in left arm: Secondary | ICD-10-CM | POA: Diagnosis not present

## 2022-04-01 DIAGNOSIS — M5412 Radiculopathy, cervical region: Secondary | ICD-10-CM | POA: Diagnosis not present

## 2022-04-01 DIAGNOSIS — M542 Cervicalgia: Secondary | ICD-10-CM | POA: Diagnosis not present

## 2022-04-05 ENCOUNTER — Other Ambulatory Visit: Payer: Self-pay | Admitting: Sports Medicine

## 2022-04-05 ENCOUNTER — Ambulatory Visit
Admission: RE | Admit: 2022-04-05 | Discharge: 2022-04-05 | Disposition: A | Discharge status: Home / Self Care | Payer: BC Managed Care – PPO | Source: Ambulatory Visit | Attending: Sports Medicine | Admitting: Sports Medicine

## 2022-04-05 DIAGNOSIS — M25512 Pain in left shoulder: Secondary | ICD-10-CM

## 2022-04-05 DIAGNOSIS — M542 Cervicalgia: Secondary | ICD-10-CM | POA: Diagnosis not present

## 2022-04-05 DIAGNOSIS — R200 Anesthesia of skin: Secondary | ICD-10-CM | POA: Diagnosis not present

## 2022-04-14 DIAGNOSIS — M542 Cervicalgia: Secondary | ICD-10-CM | POA: Diagnosis not present

## 2022-04-20 DIAGNOSIS — M542 Cervicalgia: Secondary | ICD-10-CM | POA: Diagnosis not present

## 2022-04-21 DIAGNOSIS — M542 Cervicalgia: Secondary | ICD-10-CM | POA: Diagnosis not present

## 2022-04-22 DIAGNOSIS — M542 Cervicalgia: Secondary | ICD-10-CM | POA: Diagnosis not present

## 2022-04-22 DIAGNOSIS — M5412 Radiculopathy, cervical region: Secondary | ICD-10-CM | POA: Diagnosis not present

## 2022-04-28 DIAGNOSIS — M542 Cervicalgia: Secondary | ICD-10-CM | POA: Diagnosis not present

## 2022-04-29 DIAGNOSIS — M542 Cervicalgia: Secondary | ICD-10-CM | POA: Diagnosis not present

## 2022-05-03 DIAGNOSIS — M542 Cervicalgia: Secondary | ICD-10-CM | POA: Diagnosis not present

## 2022-05-07 ENCOUNTER — Other Ambulatory Visit: Payer: Self-pay | Admitting: Sports Medicine

## 2022-05-07 DIAGNOSIS — M542 Cervicalgia: Secondary | ICD-10-CM

## 2022-05-14 ENCOUNTER — Ambulatory Visit
Admission: RE | Admit: 2022-05-14 | Discharge: 2022-05-14 | Disposition: A | Discharge status: Home / Self Care | Payer: BC Managed Care – PPO | Source: Ambulatory Visit | Attending: Sports Medicine | Admitting: Sports Medicine

## 2022-05-14 DIAGNOSIS — M542 Cervicalgia: Secondary | ICD-10-CM

## 2022-05-14 DIAGNOSIS — M47812 Spondylosis without myelopathy or radiculopathy, cervical region: Secondary | ICD-10-CM | POA: Diagnosis not present

## 2022-05-14 DIAGNOSIS — M4802 Spinal stenosis, cervical region: Secondary | ICD-10-CM | POA: Diagnosis not present

## 2022-08-26 DIAGNOSIS — M629 Disorder of muscle, unspecified: Secondary | ICD-10-CM | POA: Diagnosis not present

## 2022-08-26 DIAGNOSIS — M9981 Other biomechanical lesions of cervical region: Secondary | ICD-10-CM | POA: Diagnosis not present

## 2022-08-26 DIAGNOSIS — R030 Elevated blood-pressure reading, without diagnosis of hypertension: Secondary | ICD-10-CM | POA: Diagnosis not present

## 2022-08-27 ENCOUNTER — Other Ambulatory Visit: Payer: Self-pay | Admitting: Family Medicine

## 2022-08-27 DIAGNOSIS — M629 Disorder of muscle, unspecified: Secondary | ICD-10-CM

## 2022-09-16 ENCOUNTER — Ambulatory Visit
Admission: RE | Admit: 2022-09-16 | Discharge: 2022-09-16 | Disposition: A | Discharge status: Home / Self Care | Payer: BC Managed Care – PPO | Source: Ambulatory Visit | Attending: Family Medicine | Admitting: Family Medicine

## 2022-09-16 DIAGNOSIS — M629 Disorder of muscle, unspecified: Secondary | ICD-10-CM

## 2022-09-16 DIAGNOSIS — R2241 Localized swelling, mass and lump, right lower limb: Secondary | ICD-10-CM | POA: Diagnosis not present

## 2022-09-22 ENCOUNTER — Other Ambulatory Visit: Payer: Self-pay | Admitting: Family Medicine

## 2022-09-22 ENCOUNTER — Encounter: Payer: Self-pay | Admitting: Family Medicine

## 2022-09-22 DIAGNOSIS — M629 Disorder of muscle, unspecified: Secondary | ICD-10-CM

## 2022-09-28 ENCOUNTER — Ambulatory Visit (INDEPENDENT_AMBULATORY_CARE_PROVIDER_SITE_OTHER): Payer: BC Managed Care – PPO

## 2022-09-28 DIAGNOSIS — M629 Disorder of muscle, unspecified: Secondary | ICD-10-CM | POA: Diagnosis not present

## 2022-09-28 DIAGNOSIS — R2241 Localized swelling, mass and lump, right lower limb: Secondary | ICD-10-CM

## 2022-09-28 MED ORDER — GADOBUTROL 1 MMOL/ML IV SOLN
7.5000 mL | Freq: Once | INTRAVENOUS | Status: AC | PRN
  Administered 2022-09-28: 7.5 mL via INTRAVENOUS

## 2022-11-21 DIAGNOSIS — J301 Allergic rhinitis due to pollen: Secondary | ICD-10-CM | POA: Diagnosis not present

## 2022-11-23 DIAGNOSIS — J0110 Acute frontal sinusitis, unspecified: Secondary | ICD-10-CM | POA: Diagnosis not present

## 2022-11-29 DIAGNOSIS — J329 Chronic sinusitis, unspecified: Secondary | ICD-10-CM | POA: Diagnosis not present

## 2023-02-04 DIAGNOSIS — Z124 Encounter for screening for malignant neoplasm of cervix: Secondary | ICD-10-CM | POA: Diagnosis not present

## 2023-02-04 DIAGNOSIS — I10 Essential (primary) hypertension: Secondary | ICD-10-CM | POA: Diagnosis not present

## 2023-02-04 DIAGNOSIS — Z0000 Encounter for general adult medical examination without abnormal findings: Secondary | ICD-10-CM | POA: Diagnosis not present

## 2023-03-04 DIAGNOSIS — E7800 Pure hypercholesterolemia, unspecified: Secondary | ICD-10-CM | POA: Diagnosis not present

## 2023-03-04 DIAGNOSIS — I10 Essential (primary) hypertension: Secondary | ICD-10-CM | POA: Diagnosis not present

## 2023-05-04 DIAGNOSIS — I10 Essential (primary) hypertension: Secondary | ICD-10-CM | POA: Diagnosis not present

## 2023-07-06 DIAGNOSIS — I10 Essential (primary) hypertension: Secondary | ICD-10-CM | POA: Diagnosis not present

## 2023-09-06 DIAGNOSIS — I10 Essential (primary) hypertension: Secondary | ICD-10-CM | POA: Diagnosis not present

## 2023-10-19 DIAGNOSIS — I10 Essential (primary) hypertension: Secondary | ICD-10-CM | POA: Diagnosis not present

## 2024-02-08 DIAGNOSIS — Z0000 Encounter for general adult medical examination without abnormal findings: Secondary | ICD-10-CM | POA: Diagnosis not present

## 2024-02-08 DIAGNOSIS — E7800 Pure hypercholesterolemia, unspecified: Secondary | ICD-10-CM | POA: Diagnosis not present

## 2024-02-08 DIAGNOSIS — N920 Excessive and frequent menstruation with regular cycle: Secondary | ICD-10-CM | POA: Diagnosis not present

## 2024-02-08 DIAGNOSIS — I10 Essential (primary) hypertension: Secondary | ICD-10-CM | POA: Diagnosis not present

## 2024-02-08 DIAGNOSIS — N852 Hypertrophy of uterus: Secondary | ICD-10-CM | POA: Diagnosis not present

## 2024-07-30 DIAGNOSIS — Z1231 Encounter for screening mammogram for malignant neoplasm of breast: Secondary | ICD-10-CM

## 2024-08-03 ENCOUNTER — Other Ambulatory Visit: Payer: Self-pay | Admitting: Physician Assistant

## 2024-08-03 DIAGNOSIS — L299 Pruritus, unspecified: Secondary | ICD-10-CM

## 2024-08-03 DIAGNOSIS — N6459 Other signs and symptoms in breast: Secondary | ICD-10-CM

## 2024-08-27 ENCOUNTER — Inpatient Hospital Stay: Admitting: Hematology and Oncology

## 2024-08-27 ENCOUNTER — Inpatient Hospital Stay

## 2024-08-28 ENCOUNTER — Ambulatory Visit

## 2024-08-28 ENCOUNTER — Ambulatory Visit
Admission: RE | Admit: 2024-08-28 | Discharge: 2024-08-28 | Disposition: A | Source: Ambulatory Visit | Attending: Physician Assistant | Admitting: Physician Assistant

## 2024-08-28 DIAGNOSIS — N6459 Other signs and symptoms in breast: Secondary | ICD-10-CM

## 2024-08-28 DIAGNOSIS — L299 Pruritus, unspecified: Secondary | ICD-10-CM

## 2024-08-29 ENCOUNTER — Other Ambulatory Visit: Payer: Self-pay | Admitting: Hematology and Oncology

## 2024-08-29 DIAGNOSIS — D649 Anemia, unspecified: Secondary | ICD-10-CM

## 2024-08-30 ENCOUNTER — Inpatient Hospital Stay: Admitting: Hematology and Oncology

## 2024-08-30 ENCOUNTER — Other Ambulatory Visit: Payer: Self-pay | Admitting: Hematology and Oncology

## 2024-08-30 ENCOUNTER — Telehealth: Payer: Self-pay | Admitting: Hematology and Oncology

## 2024-08-30 ENCOUNTER — Encounter: Payer: Self-pay | Admitting: Hematology and Oncology

## 2024-08-30 ENCOUNTER — Inpatient Hospital Stay

## 2024-08-30 VITALS — BP 132/89 | HR 65 | Temp 99.0°F | Resp 18 | Ht 65.85 in | Wt 174.5 lb

## 2024-08-30 DIAGNOSIS — D500 Iron deficiency anemia secondary to blood loss (chronic): Secondary | ICD-10-CM | POA: Insufficient documentation

## 2024-08-30 DIAGNOSIS — D649 Anemia, unspecified: Secondary | ICD-10-CM

## 2024-08-30 LAB — CBC WITH DIFFERENTIAL (CANCER CENTER ONLY)
Abs Immature Granulocytes: 0.01 10*3/uL (ref 0.00–0.07)
Basophils Absolute: 0 10*3/uL (ref 0.0–0.1)
Basophils Relative: 1 %
Eosinophils Absolute: 0.1 10*3/uL (ref 0.0–0.5)
Eosinophils Relative: 3 %
HCT: 30.3 % — ABNORMAL LOW (ref 36.0–46.0)
Hemoglobin: 9.7 g/dL — ABNORMAL LOW (ref 12.0–15.0)
Immature Granulocytes: 1 %
Lymphocytes Relative: 42 %
Lymphs Abs: 0.9 10*3/uL (ref 0.7–4.0)
MCH: 25.9 pg — ABNORMAL LOW (ref 26.0–34.0)
MCHC: 32 g/dL (ref 30.0–36.0)
MCV: 81 fL (ref 80.0–100.0)
Monocytes Absolute: 0.2 10*3/uL (ref 0.1–1.0)
Monocytes Relative: 7 %
Neutro Abs: 1 10*3/uL — ABNORMAL LOW (ref 1.7–7.7)
Neutrophils Relative %: 46 %
Platelet Count: 282 10*3/uL (ref 150–400)
RBC: 3.74 MIL/uL — ABNORMAL LOW (ref 3.87–5.11)
RDW: 14.6 % (ref 11.5–15.5)
WBC Count: 2.2 10*3/uL — ABNORMAL LOW (ref 4.0–10.5)
nRBC: 0 % (ref 0.0–0.2)

## 2024-08-30 LAB — CMP (CANCER CENTER ONLY)
ALT: 10 U/L (ref 0–44)
AST: 15 U/L (ref 15–41)
Albumin: 4.4 g/dL (ref 3.5–5.0)
Alkaline Phosphatase: 45 U/L (ref 38–126)
Anion gap: 10 (ref 5–15)
BUN: 10 mg/dL (ref 6–20)
CO2: 24 mmol/L (ref 22–32)
Calcium: 8.9 mg/dL (ref 8.9–10.3)
Chloride: 104 mmol/L (ref 98–111)
Creatinine: 0.92 mg/dL (ref 0.44–1.00)
GFR, Estimated: >60 mL/min
Glucose, Bld: 97 mg/dL (ref 70–99)
Potassium: 3.8 mmol/L (ref 3.5–5.1)
Sodium: 137 mmol/L (ref 135–145)
Total Bilirubin: 0.3 mg/dL (ref 0.0–1.2)
Total Protein: 7.4 g/dL (ref 6.5–8.1)

## 2024-08-30 LAB — FERRITIN: Ferritin: 9 ng/mL — ABNORMAL LOW (ref 11–307)

## 2024-08-30 LAB — FOLATE: Folate: 10.3 ng/mL

## 2024-08-30 LAB — IRON AND TIBC
Iron: 31 ug/dL (ref 28–170)
Saturation Ratios: 6 % — ABNORMAL LOW (ref 10.4–31.8)
TIBC: 514 ug/dL — ABNORMAL HIGH (ref 250–450)
UIBC: 483 ug/dL

## 2024-08-30 LAB — VITAMIN B12: Vitamin B-12: 271 pg/mL (ref 180–914)

## 2024-08-30 LAB — TSH: TSH: 1.55 u[IU]/mL (ref 0.350–4.500)

## 2024-08-30 NOTE — Telephone Encounter (Signed)
 Scheduled appointments per 2/5 los. Talked with the patient and she is aware of all made appointments.

## 2024-08-30 NOTE — Progress Notes (Signed)
 " [facility] [address] [place],  [place]  [address] [phone]  Clinic Day:  08/30/2024   Referring physician: Redmon, Noelle, PA  Patient Care Team: Patient Care Team: Patient, No Pcp Per as PCP - General (General Practice)   REASON FOR CONSULTATION:  Anemia and leukopenia  HISTORY OF PRESENT ILLNESS:   Christina Gallegos is a 44 y.o. female with a history of anemia and leukopenia who is referred in consultation by Sydelle Close, PA for assessment and management. She notes having heavy menstrual periods and was evaluated by gynecology; however, has not been given a plan. She states sometimes having 2 cycles a month. She did undergo pelvic ultrasound which revealed uterine fibroid. She denies fever, chills, nausea or vomiting. She denies shortness of breath, chest pain or cough. She denies issue with bowel or bladder.   Medical history includes hypertension, anemia, heavy menstrual periods, high cholesterol. No significant surgical history. Family history includes hypertension, breast cancer  REVIEW OF SYSTEMS:   Review of Systems  Constitutional: Negative.   HENT:  Negative.    Eyes: Negative.   Respiratory: Negative.    Cardiovascular: Negative.   Gastrointestinal: Negative.   Endocrine: Negative.   Genitourinary: Negative.    Musculoskeletal: Negative.   Skin: Negative.   Neurological: Negative.   Hematological: Negative.   Psychiatric/Behavioral: Negative.       VITALS:   Last menstrual period 08/03/2024.  Wt Readings from Last 3 Encounters:  04/28/15 162 lb (73.5 kg)  04/16/13 172 lb (78 kg)  03/31/13 178 lb (80.7 kg)    There is no height or weight on file to calculate BMI.  Performance status (ECOG): 1 - Symptomatic but completely ambulatory  PHYSICAL EXAM:   Physical Exam Constitutional:      Appearance: Normal appearance. She is normal weight.  HENT:     Head: Normocephalic and atraumatic.     Nose: Nose normal.      Mouth/Throat:     Mouth: Mucous membranes are moist.  Eyes:     Pupils: Pupils are equal, round, and reactive to light.  Cardiovascular:     Rate and Rhythm: Normal rate and regular rhythm.     Pulses: Normal pulses.     Heart sounds: Normal heart sounds.  Pulmonary:     Effort: Pulmonary effort is normal.     Breath sounds: Normal breath sounds.  Abdominal:     General: Bowel sounds are normal.     Palpations: Abdomen is soft.  Musculoskeletal:        General: Normal range of motion.     Cervical back: Normal range of motion.  Skin:    General: Skin is warm and dry.  Neurological:     General: No focal deficit present.     Mental Status: She is alert and oriented to person, place, and time. Mental status is at baseline.  Psychiatric:        Mood and Affect: Mood normal.        Behavior: Behavior normal.        Thought Content: Thought content normal.        Judgment: Judgment normal.      LABS:      Latest Ref Rng & Units 04/28/2015   10:34 AM 03/31/2013    8:53 AM  CBC  WBC 4.6 - 10.2 K/uL 6.3  2.8   Hemoglobin 12.2 - 16.2 g/dL 87.7  86.5   Hematocrit 37.7 - 47.9 % 37.0  41.4       Latest Ref Rng & Units 03/31/2013    8:49 AM  CMP  Glucose 70 - 99 mg/dL 91   BUN 6 - 23 mg/dL 9   Creatinine 9.49 - 8.89 mg/dL 9.21   Sodium 864 - 854 mEq/L 136   Potassium 3.5 - 5.3 mEq/L 4.2   Chloride 96 - 112 mEq/L 104   CO2 19 - 32 mEq/L 27   Calcium 8.4 - 10.5 mg/dL 9.1   Total Protein 6.0 - 8.3 g/dL 7.3   Total Bilirubin 0.3 - 1.2 mg/dL 0.4   Alkaline Phos 39 - 117 U/L 45   AST 0 - 37 U/L 16   ALT 0 - 35 U/L 11      No results found for: CEA1, CEA / No results found for: CEA1, CEA No results found for: PSA1 No results found for: CAN199 No results found for: CAN125  No results found for: TOTALPROTELP, ALBUMINELP, A1GS, A2GS, BETS, BETA2SER, GAMS, MSPIKE, SPEI No results found for: TIBC, FERRITIN, IRONPCTSAT No results found for:  LDH  STUDIES:   MM 3D DIAGNOSTIC MAMMOGRAM BILATERAL BREAST Result Date: 08/28/2024 CLINICAL DATA:  Bilateral diffuse breast itching, since improved EXAM: DIGITAL DIAGNOSTIC BILATERAL MAMMOGRAM WITH TOMOSYNTHESIS AND CAD TECHNIQUE: Bilateral digital diagnostic mammography and breast tomosynthesis was performed. The images were evaluated with computer-aided detection. COMPARISON:  Previous exam(s). ACR Breast Density Category c: The breasts are heterogeneously dense, which may obscure small masses. FINDINGS: Diagnostic images were obtained of bilateral breasts. No suspicious mammographic etiology for itching is identified. There are multiple bilateral round and oval circumscribed masses, most consistent with benign fibroadenomas or cysts. Several mammographically stable masses have developed coarse dystrophic calcifications, consistent with benign fibroadenomas. A questioned asymmetry resolves with additional views, consistent with overlapping tissue. No suspicious mass, distortion, or microcalcifications are identified to suggest presence of malignancy. IMPRESSION: 1. No mammographic evidence of malignancy bilaterally. 2. No suspicious mammographic etiology for itching is identified. Involuting fibroadenomas are noted. This can indicate hormonal changes such is perimenopause/menopause. Any further workup of the patient's symptoms should be based on the clinical assessment. Should new nipple issues be identified, the next step in imaging would be dedicated breast MRI with and without contrast. RECOMMENDATION: Screening mammogram in one year.(Code:SM-B-01Y) I have discussed the findings and recommendations with the patient. If applicable, a reminder letter will be sent to the patient regarding the next appointment. BI-RADS CATEGORY  2: Benign. Electronically Signed   By: Corean Salter M.D.   On: 08/28/2024 13:38      HISTORY:   No past medical history on file.  No past surgical history on  file.  Family History  Problem Relation Age of Onset   Breast cancer Paternal Grandmother     Social History:  reports that she has never smoked. She does not have any smokeless tobacco history on file. She reports that she does not drink alcohol and does not use drugs.The patient is alone  today.  Allergies: Allergies[1]  Current Medications: Current Outpatient Medications  Medication Sig Dispense Refill   Multiple Vitamins-Minerals (MULTIPLE VITAMINS/WOMENS PO) Take 1 tablet by mouth daily.     oxyCODONE -acetaminophen  (ROXICET) 5-325 MG tablet Take 1 tablet by mouth every 8 (eight) hours as needed for severe pain. 20 tablet 0   No current facility-administered medications for this visit.     ASSESSMENT & PLAN:   Assessment:  DACEY MILBERGER is a 44 y.o. female with decreased hemoglobin, 9.7 today and  decreased white count, 2.2 today with normal platelets. She notes that she has heavy menstrual periods, sometimes 2 a month. She underwent evaluation including pelvic ultrasound with gynecology and was noted to have 2 fibroids. She has not followed up with gynecology; however will reach out to them today for follow up. We discussed that her heavy bleeding is most likely the cause of her anemia. She has tried oral iron in the past without success. We discussed potential treatment options including IV iron. We discussed side effects including possible infusion reactions. Iron studies are pending. If appropriate, we will plan for IV Venofer and have her return 4 weeks after her last infusion. She is agreeable to this plan.   Plan: 1.  IV Venofer, if appropriate. Return to clinic 4 weeks post last infusion.   I discussed the assessment and treatment plan with the patient.  The patient was provided an opportunity to ask questions and all were answered.  The patient agreed with the plan and demonstrated an understanding of the instructions.    Thank you for the referral    45 minutes was  spent in patient care.  This included time spent preparing to see the patient (e.g., review of tests), obtaining and/or reviewing separately obtained history, counseling and educating the patient/family/caregiver, ordering medications, tests, or procedures; documenting clinical information in the electronic or other health record, independently interpreting results and communicating results to the patient/family/caregiver as well as coordination of care.      Eleanor DELENA Bach, NP   Family Nurse Practitioner - Board Certified [facility] [phone]         [1] No Known Allergies  "

## 2024-09-06 ENCOUNTER — Inpatient Hospital Stay

## 2024-09-11 ENCOUNTER — Inpatient Hospital Stay

## 2024-09-14 ENCOUNTER — Inpatient Hospital Stay

## 2024-09-18 ENCOUNTER — Inpatient Hospital Stay

## 2024-09-21 ENCOUNTER — Inpatient Hospital Stay

## 2024-10-18 ENCOUNTER — Inpatient Hospital Stay: Admitting: Hematology and Oncology

## 2024-10-18 ENCOUNTER — Inpatient Hospital Stay
